# Patient Record
Sex: Female | Born: 1955 | Race: White | Hispanic: No | Marital: Married | Smoking: Never smoker
Health system: Southern US, Community
[De-identification: ages and names within clinical notes are randomized; demographics above are authoritative.]

## PROBLEM LIST (undated history)

## (undated) DIAGNOSIS — Z8349 Family history of other endocrine, nutritional and metabolic diseases: Secondary | ICD-10-CM

## (undated) DIAGNOSIS — D509 Iron deficiency anemia, unspecified: Secondary | ICD-10-CM

## (undated) DIAGNOSIS — M722 Plantar fascial fibromatosis: Secondary | ICD-10-CM

## (undated) DIAGNOSIS — F419 Anxiety disorder, unspecified: Secondary | ICD-10-CM

## (undated) DIAGNOSIS — G4700 Insomnia, unspecified: Secondary | ICD-10-CM

## (undated) DIAGNOSIS — R202 Paresthesia of skin: Secondary | ICD-10-CM

## (undated) HISTORY — PX: SHOULDER SURGERY: SHX246

## (undated) HISTORY — PX: KNEE ARTHROSCOPY: SUR90

## (undated) HISTORY — DX: Family history of other endocrine, nutritional and metabolic diseases: Z83.49

## (undated) HISTORY — DX: Anxiety disorder, unspecified: F41.9

## (undated) HISTORY — DX: Insomnia, unspecified: G47.00

## (undated) HISTORY — DX: Iron deficiency anemia, unspecified: D50.9

## (undated) HISTORY — DX: Paresthesia of skin: R20.2

---

## 1997-09-05 ENCOUNTER — Ambulatory Visit (HOSPITAL_COMMUNITY): Admission: RE | Admit: 1997-09-05 | Discharge: 1997-09-05 | Payer: Self-pay | Admitting: Gastroenterology

## 1997-09-11 ENCOUNTER — Emergency Department (HOSPITAL_COMMUNITY): Admission: EM | Admit: 1997-09-11 | Discharge: 1997-09-11 | Payer: Self-pay | Admitting: Emergency Medicine

## 1997-09-12 ENCOUNTER — Ambulatory Visit (HOSPITAL_COMMUNITY): Admission: RE | Admit: 1997-09-12 | Discharge: 1997-09-12 | Payer: Self-pay

## 1998-06-18 ENCOUNTER — Ambulatory Visit (HOSPITAL_COMMUNITY): Admission: RE | Admit: 1998-06-18 | Discharge: 1998-06-18 | Payer: Self-pay | Admitting: Family Medicine

## 1999-06-05 ENCOUNTER — Encounter: Admission: RE | Admit: 1999-06-05 | Discharge: 1999-06-05 | Payer: Self-pay | Admitting: Obstetrics and Gynecology

## 1999-06-05 ENCOUNTER — Encounter: Payer: Self-pay | Admitting: Obstetrics and Gynecology

## 1999-09-05 ENCOUNTER — Encounter (INDEPENDENT_AMBULATORY_CARE_PROVIDER_SITE_OTHER): Payer: Self-pay | Admitting: Specialist

## 1999-09-05 ENCOUNTER — Ambulatory Visit (HOSPITAL_COMMUNITY): Admission: RE | Admit: 1999-09-05 | Discharge: 1999-09-05 | Payer: Self-pay | Admitting: Gastroenterology

## 1999-09-26 ENCOUNTER — Ambulatory Visit (HOSPITAL_COMMUNITY): Admission: RE | Admit: 1999-09-26 | Discharge: 1999-09-26 | Payer: Self-pay | Admitting: Gastroenterology

## 1999-10-24 ENCOUNTER — Encounter (HOSPITAL_COMMUNITY): Admission: RE | Admit: 1999-10-24 | Discharge: 2000-01-22 | Payer: Self-pay | Admitting: Gastroenterology

## 2003-06-12 ENCOUNTER — Encounter: Admission: RE | Admit: 2003-06-12 | Discharge: 2003-06-26 | Payer: Self-pay | Admitting: Orthopedic Surgery

## 2004-02-14 ENCOUNTER — Ambulatory Visit (HOSPITAL_COMMUNITY): Admission: RE | Admit: 2004-02-14 | Discharge: 2004-02-14 | Payer: Self-pay | Admitting: Orthopedic Surgery

## 2004-02-14 ENCOUNTER — Ambulatory Visit (HOSPITAL_BASED_OUTPATIENT_CLINIC_OR_DEPARTMENT_OTHER): Admission: RE | Admit: 2004-02-14 | Discharge: 2004-02-14 | Payer: Self-pay | Admitting: Orthopedic Surgery

## 2004-03-06 ENCOUNTER — Ambulatory Visit (HOSPITAL_BASED_OUTPATIENT_CLINIC_OR_DEPARTMENT_OTHER): Admission: RE | Admit: 2004-03-06 | Discharge: 2004-03-06 | Payer: Self-pay | Admitting: Orthopedic Surgery

## 2007-07-21 ENCOUNTER — Ambulatory Visit (HOSPITAL_BASED_OUTPATIENT_CLINIC_OR_DEPARTMENT_OTHER): Admission: RE | Admit: 2007-07-21 | Discharge: 2007-07-21 | Payer: Self-pay | Admitting: Orthopedic Surgery

## 2008-04-20 ENCOUNTER — Encounter: Admission: RE | Admit: 2008-04-20 | Discharge: 2008-04-20 | Payer: Self-pay | Admitting: Family Medicine

## 2008-04-20 IMAGING — MG MM SCREEN MAMMOGRAM BILATERAL
4 series · 4 of 4 positions shown · non-contrast
Comparison: Prior studies.

DG SCREEN MAMMOGRAM BILATERAL
Bilateral CC and MLO view(s) were taken.
Technologist: K-H

DIGITAL SCREENING MAMMOGRAM WITH CAD:

[R CC]
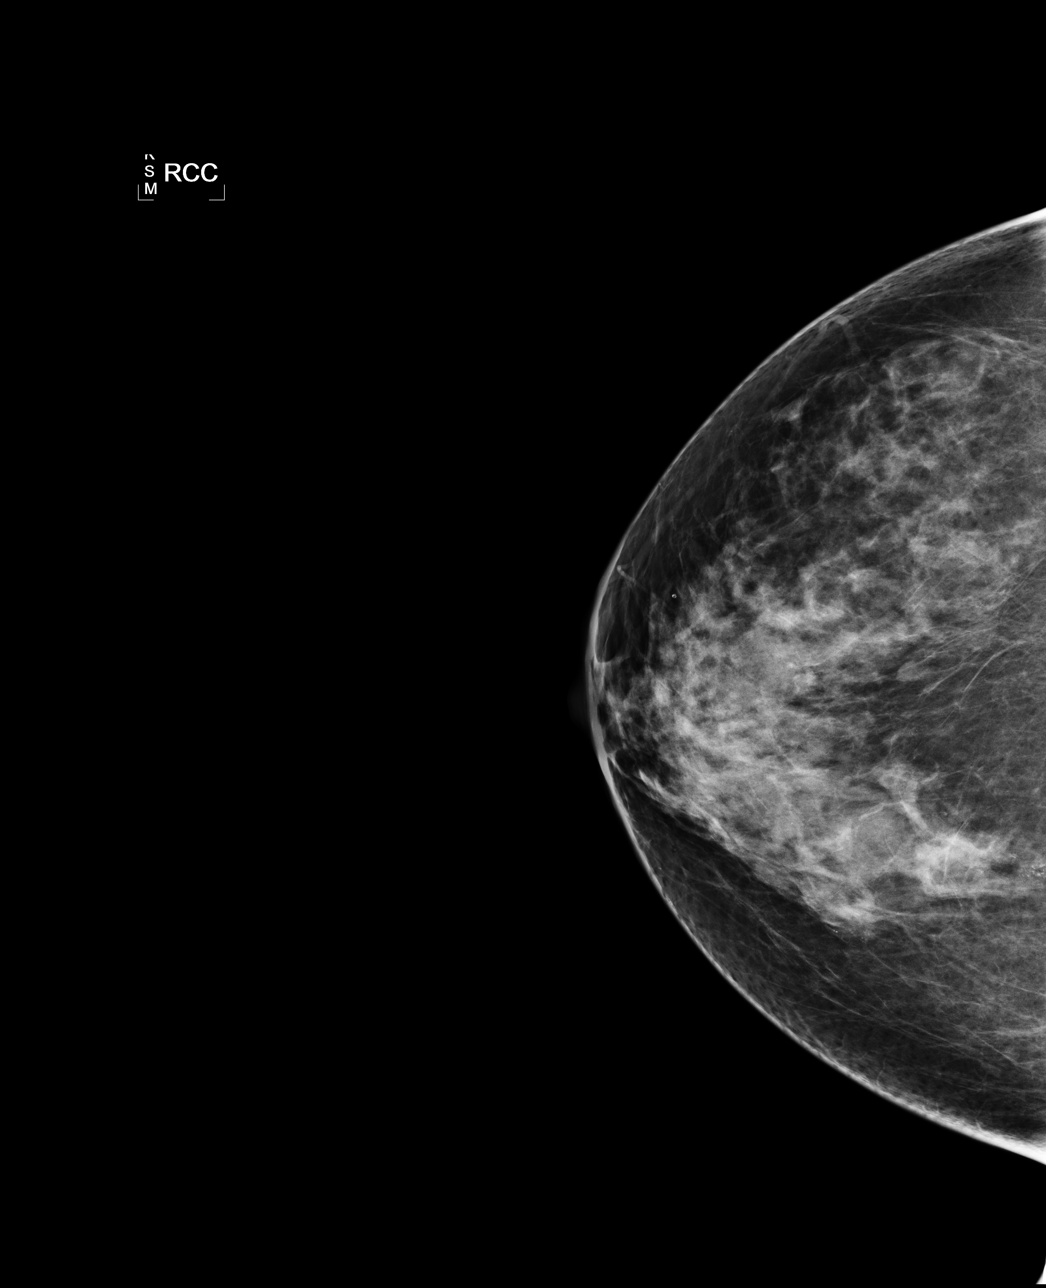

[L CC]
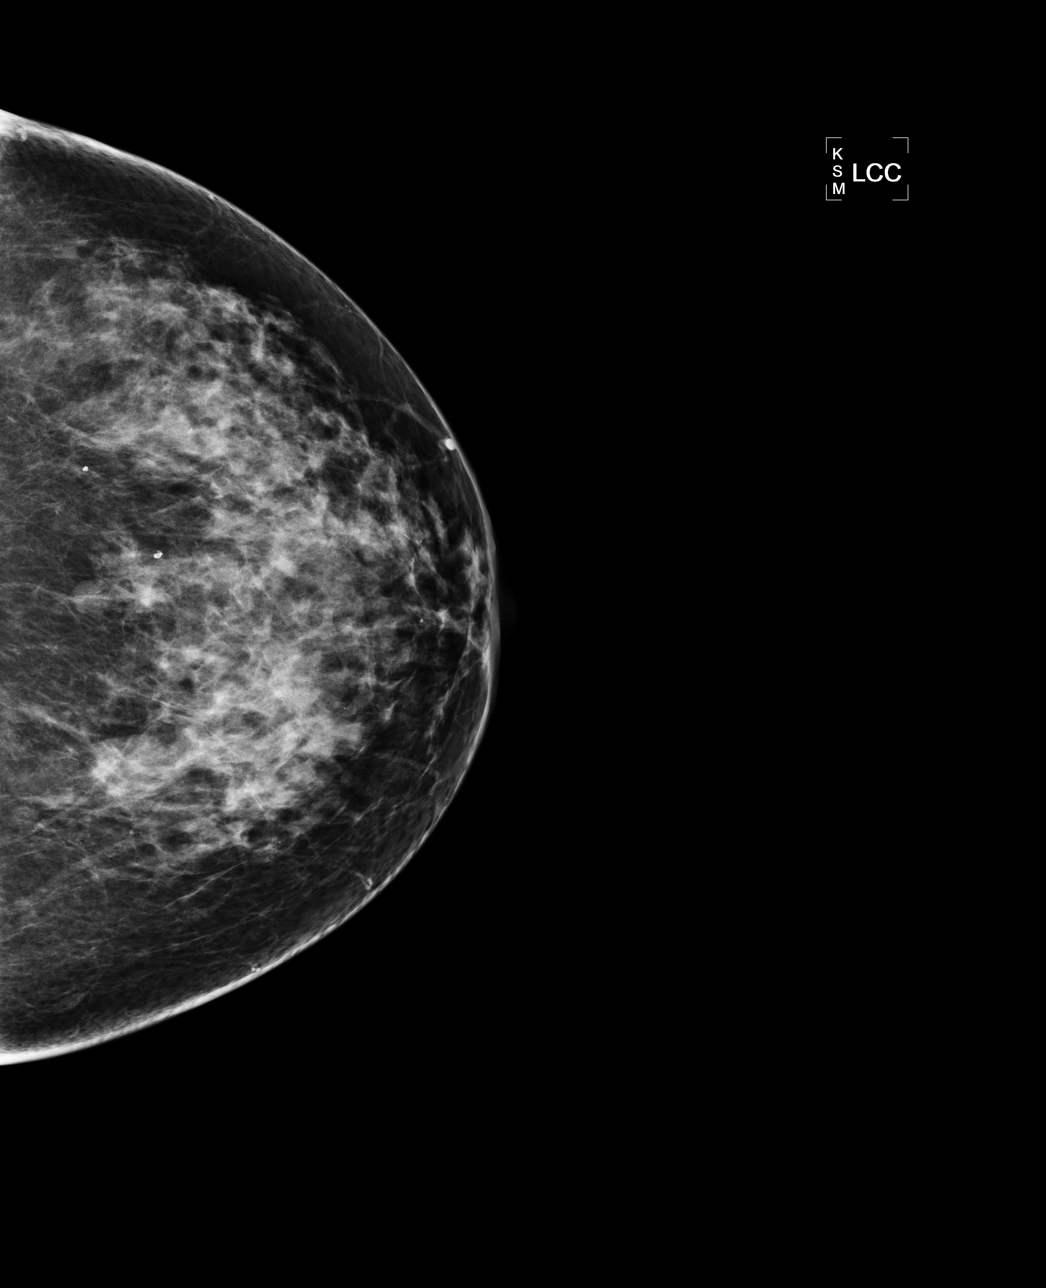

[L MLO]
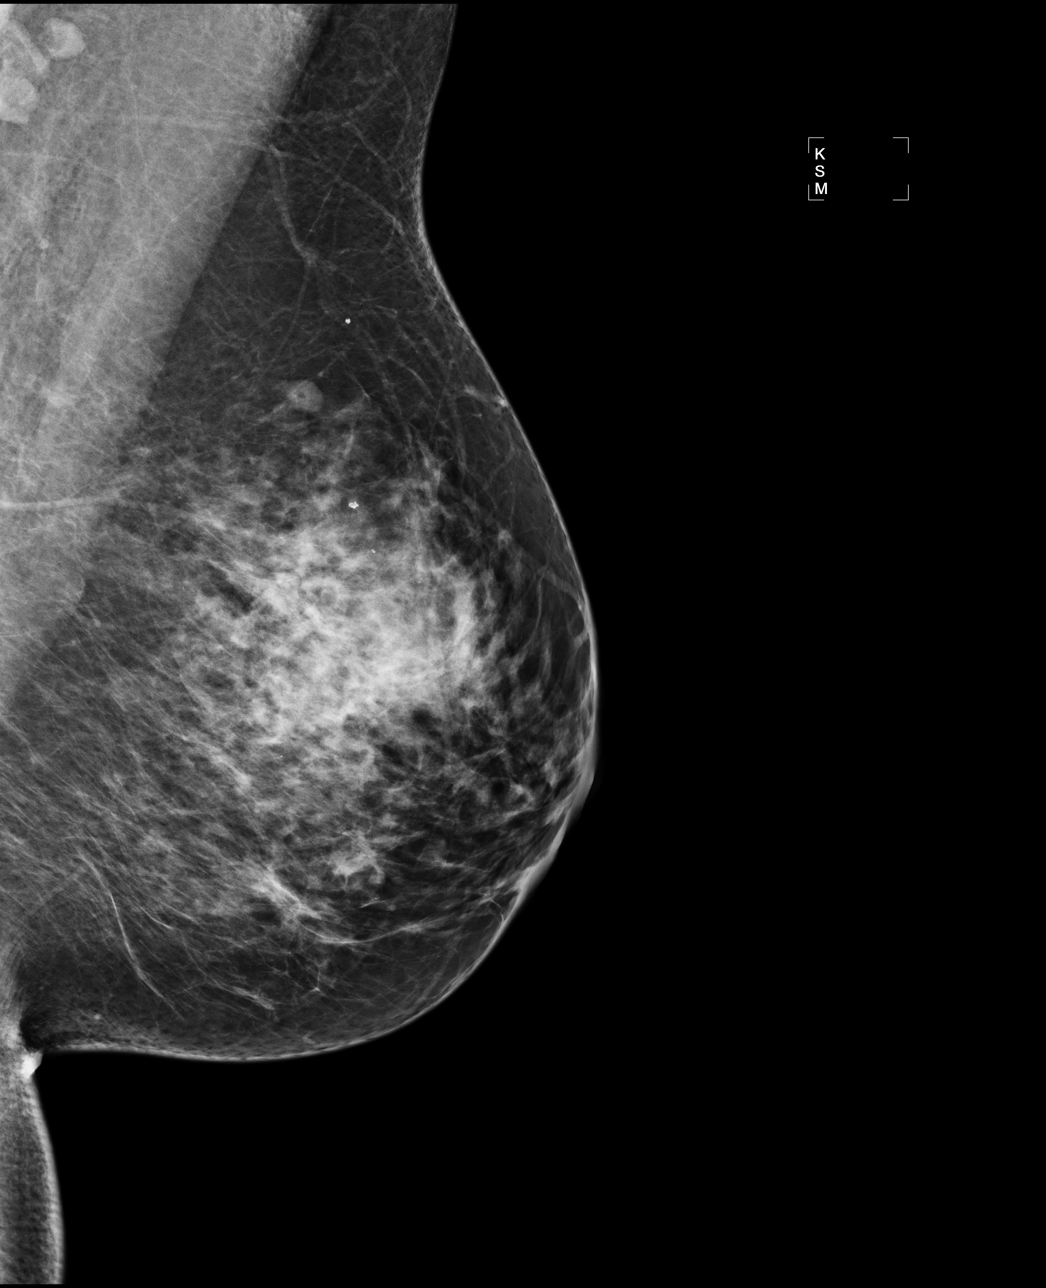

[R MLO]
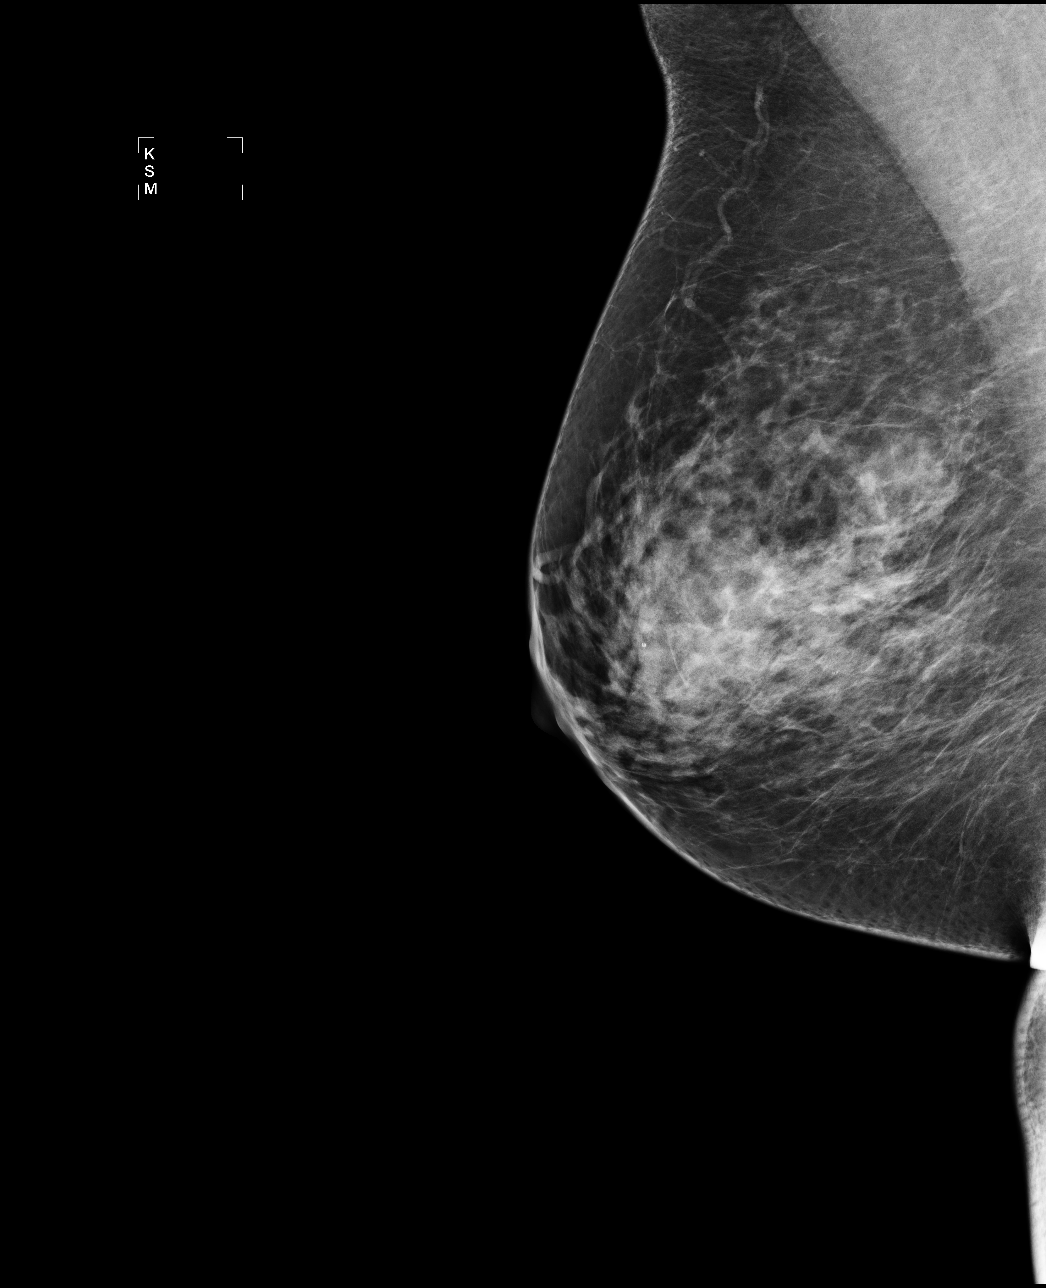

[4 of 4 positions shown; findings below may reference images not displayed]

The breast tissue is heterogeneously dense.  A possible mass is noted in the left breast.  Spot 
compression views and possibly sonography are recommended for further evaluation.  There are 
calcifications in the right breast.  Characterization with magnification views is recommended.
IMPRESSION: Possible mass, left breast and calcifications right breast.   Additional evaluation is indicated.  
The patient will be contacted for additional studies and a supplemental report will follow.

ASSESSMENT: Need additional imaging evaluation and/or prior mammograms for comparison - BI-RADS 0

Further imaging of both breasts.
ANALYZED BY COMPUTER AIDED DETECTION. , THIS PROCEDURE WAS A DIGITAL MAMMOGRAM.

## 2008-05-04 ENCOUNTER — Encounter: Admission: RE | Admit: 2008-05-04 | Discharge: 2008-05-04 | Payer: Self-pay | Admitting: Family Medicine

## 2008-05-04 ENCOUNTER — Encounter (INDEPENDENT_AMBULATORY_CARE_PROVIDER_SITE_OTHER): Payer: Self-pay | Admitting: Family Medicine

## 2008-05-04 IMAGING — MG MM BREAST STEREO BIOPSY*R*
3 series · 3 of 3 positions shown · non-contrast
Comparison: none

Addendum Begins

The final pathological diagnosis is benign breast parenchyma with
fibrocystic changes and numerous microcalcifications.  This is
concordant with the imaging findings.  The results were discussed
with the patient by telephone on [DATE].  Her questions were
answered.  She reported some residual tenderness at the biopsy site
with no bruising or palpable hematoma.  A six month followup right
diagnostic mammogram, with magnification views, is recommended in 6
months.  This was discussed with the patient.
Addendum Ends
CLINICAL DATA: Suspicious clusters of calcifications within the
inner upper right breast.

[R CC (1 of 2)]
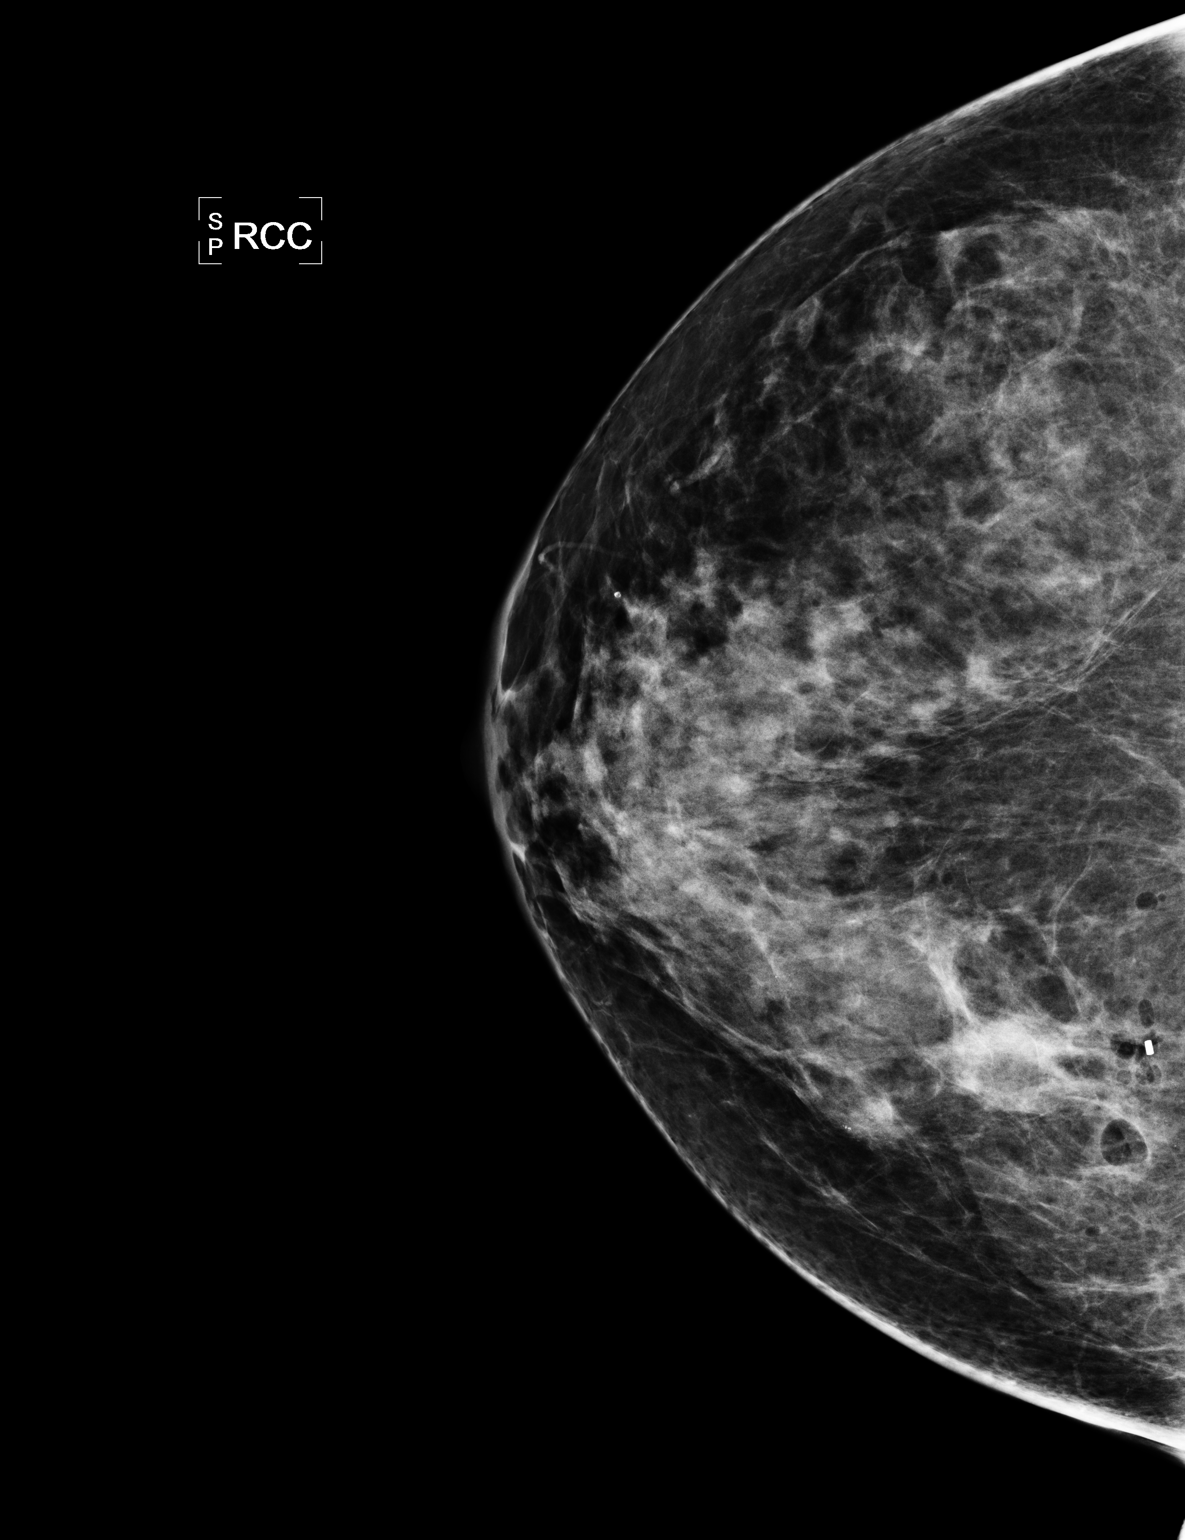

[R CC (2 of 2)]
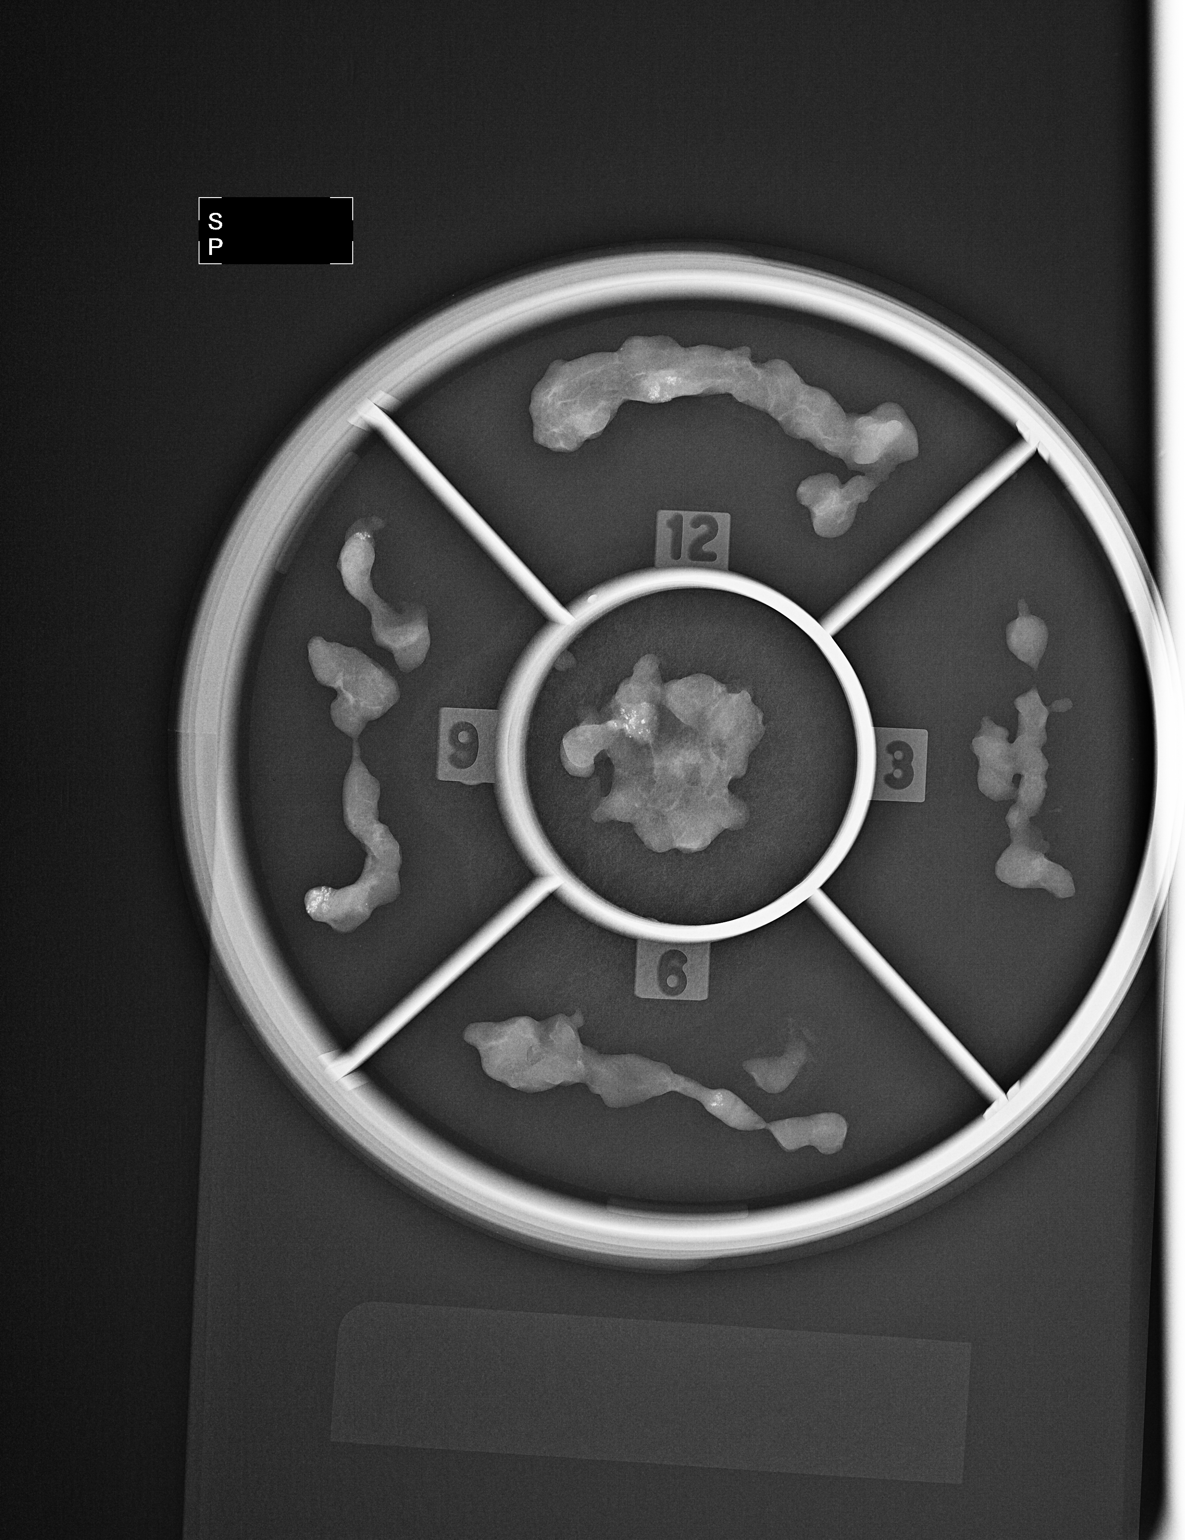

[R ML]
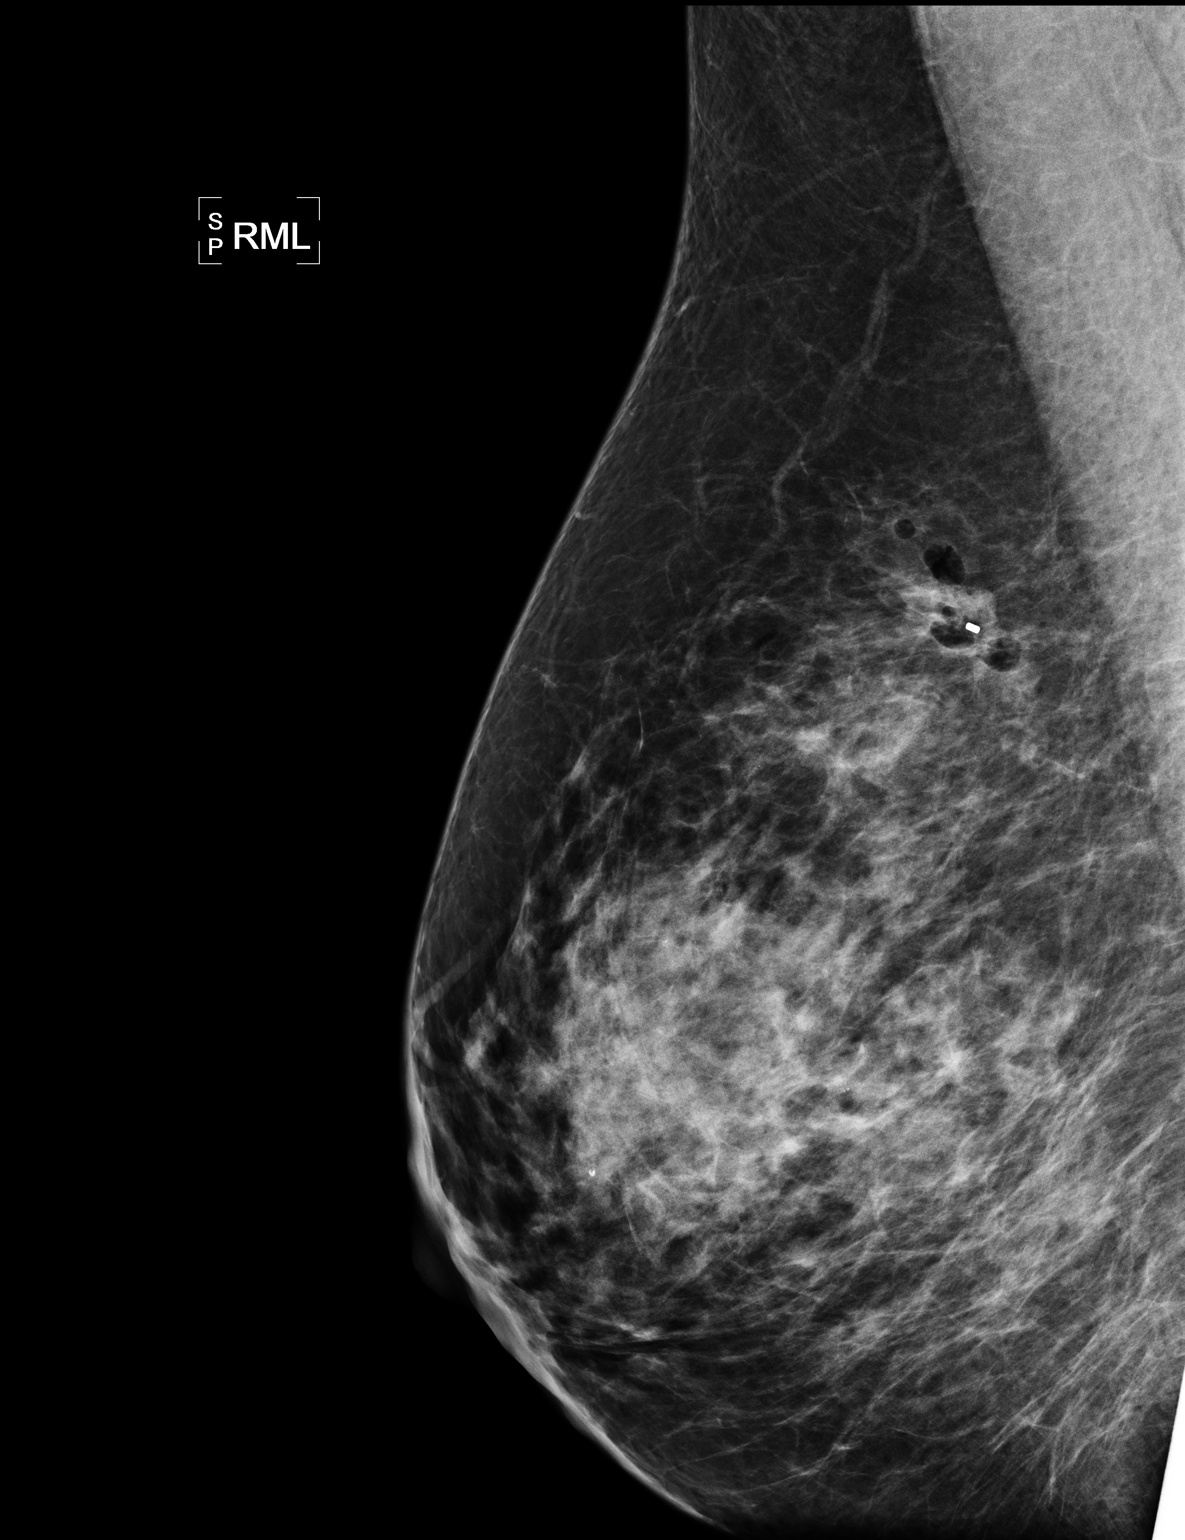

[3 of 3 positions shown; findings below may reference images not displayed]

STEREOTACTIC-GUIDED VACUUM ASSISTED BIOPSY OF THE RIGHT BREAST AND
SPECIMEN RADIOGRAPH

I met with the patient, and we discussed the procedure of
stereotactic-guided biopsy, including risks, benefits, and
alternatives.  Specifically, we discussed the risks of infection,
bleeding, tissue injury, clip migration, and inadequate sampling.
Informed, written consent was given.

Using sterile technique, 2% lidocaine, stereotactic guidance, and a
9 gauge vacuum assisted device, biopsy was performed of the larger
cluster of heterogeneous calcifications within the inner upper
right breast.  Specimen radiograph was performed, showing
calcifications within the specimen.  Specimens with calcifications
are identified for pathology.

At the conclusion of the procedure, a tissue marker clip was
deployed into the biopsy cavity.  Follow-up 2-view mammogram
confirmed clip placement to be 1 cm lateral to the biopsied
calcifications.
IMPRESSION: Stereotactic-guided biopsy of right breast calcifications.  Please
note that the clip lies 1 cm lateral to the biopsy site.

If the biopsy is negative, then 6 months follow-up is recommended
of both of these clusters of calcifications.  The biopsy is
positive for carcinoma, then biopsy of the adjacent smaller second
area of heterogeneous calcifications and / or MRI may be warranted.

## 2008-05-04 IMAGING — MG MM DIAGNOSTIC LTD BILATERAL
6 series · 6 of 6 positions shown · non-contrast
Comparison: [DATE] and [DATE]

CLINICAL DATA: Abnormal screening mammogram with right breast
calcifications and possible left breast mass.

[REDACTED] BILATERAL  MAMMOGRAM   AND BILATERAL
BREAST ULTRASOUND:

[L CC]
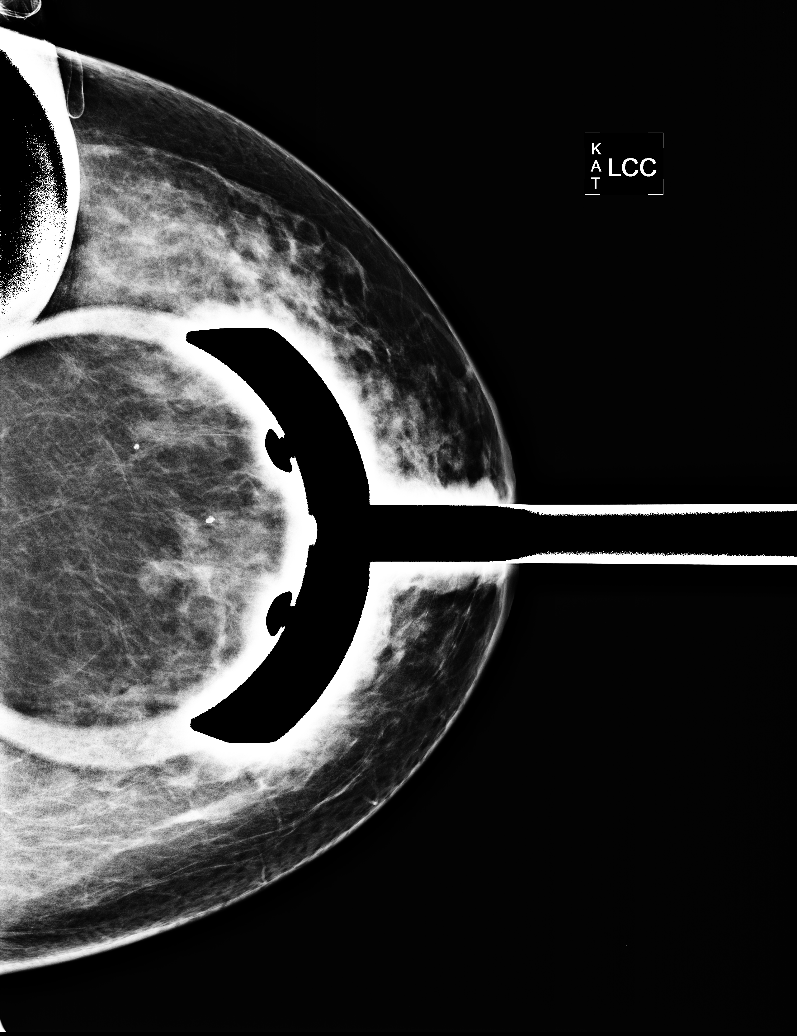

[R ML (1 of 2)]
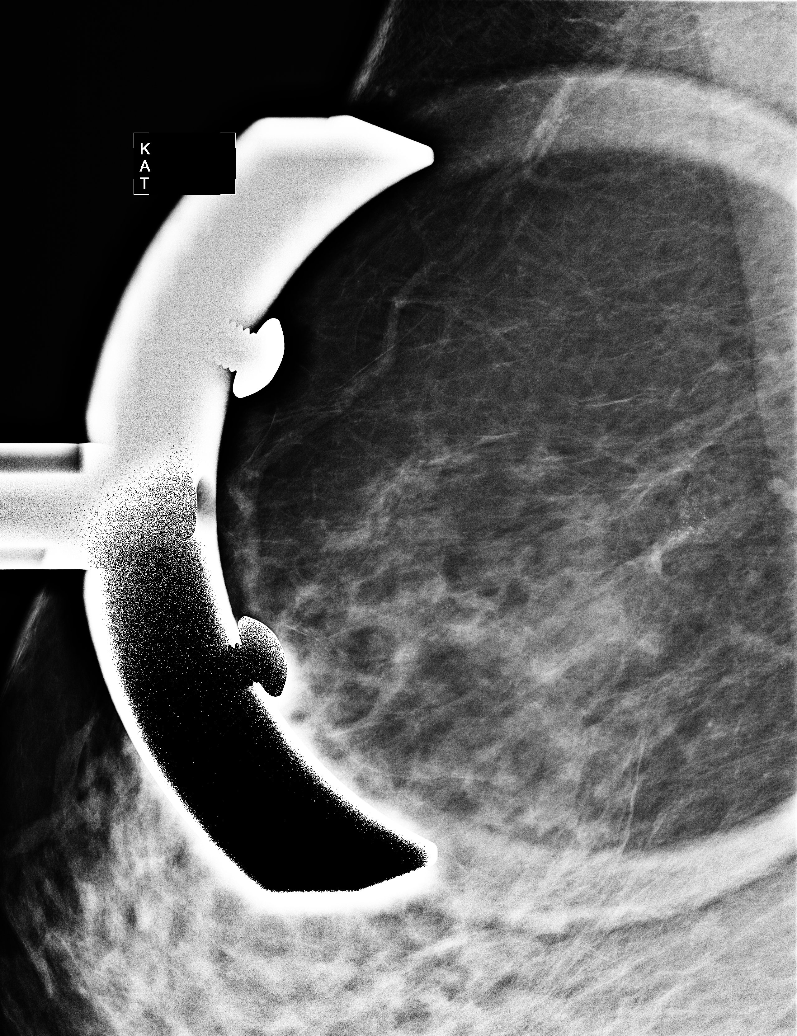

[L MLO]
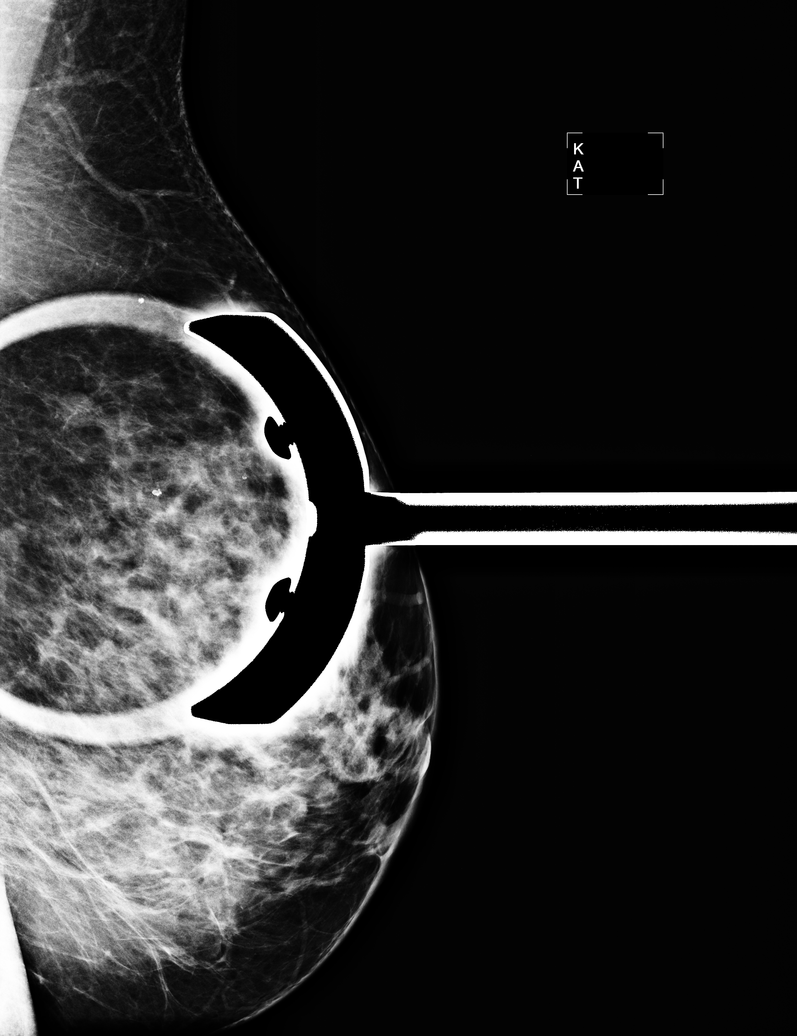

[R CC (1 of 2)]
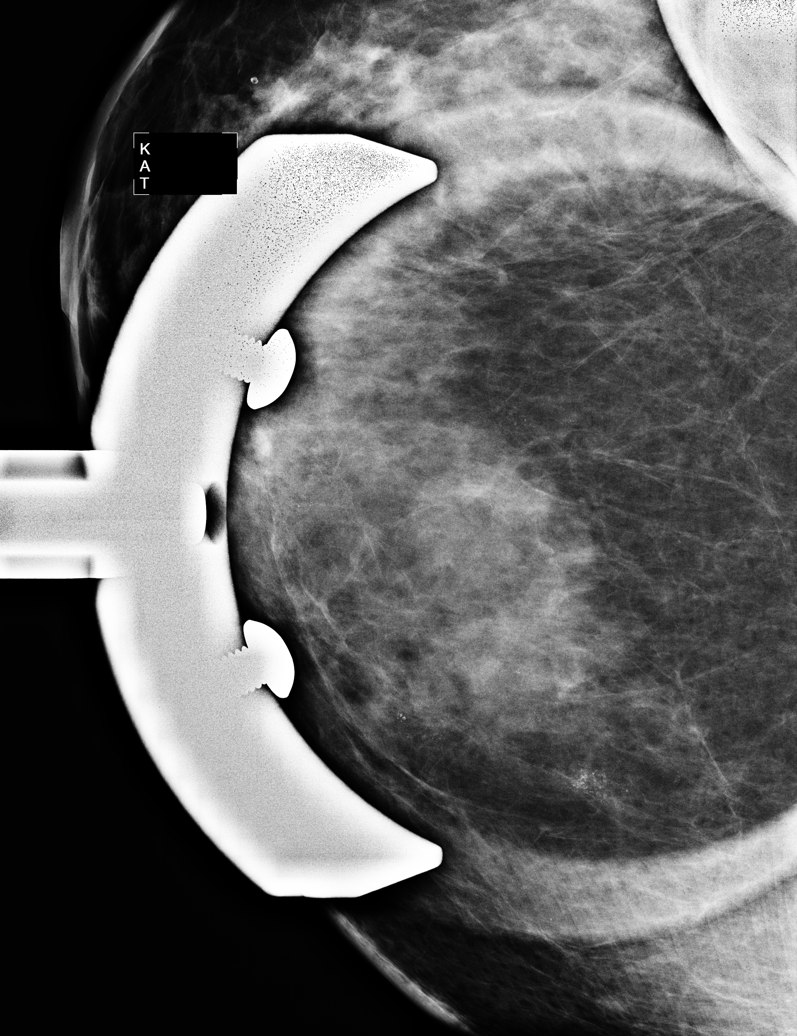

[R ML (2 of 2)]
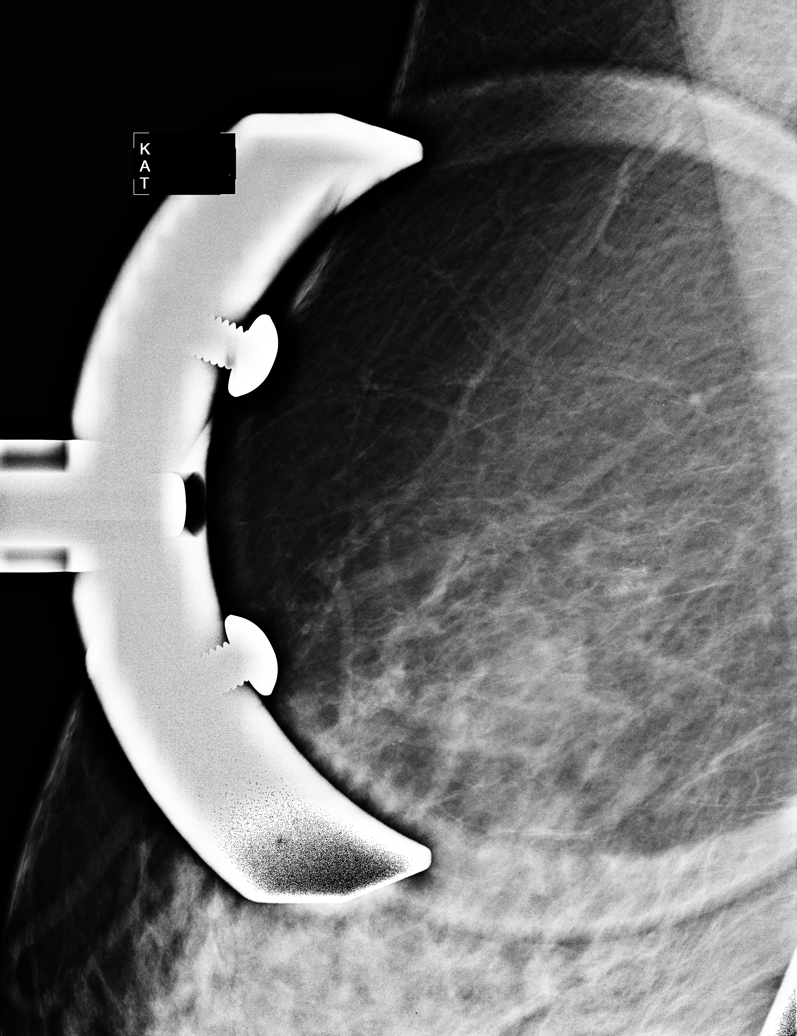

[R CC (2 of 2)]
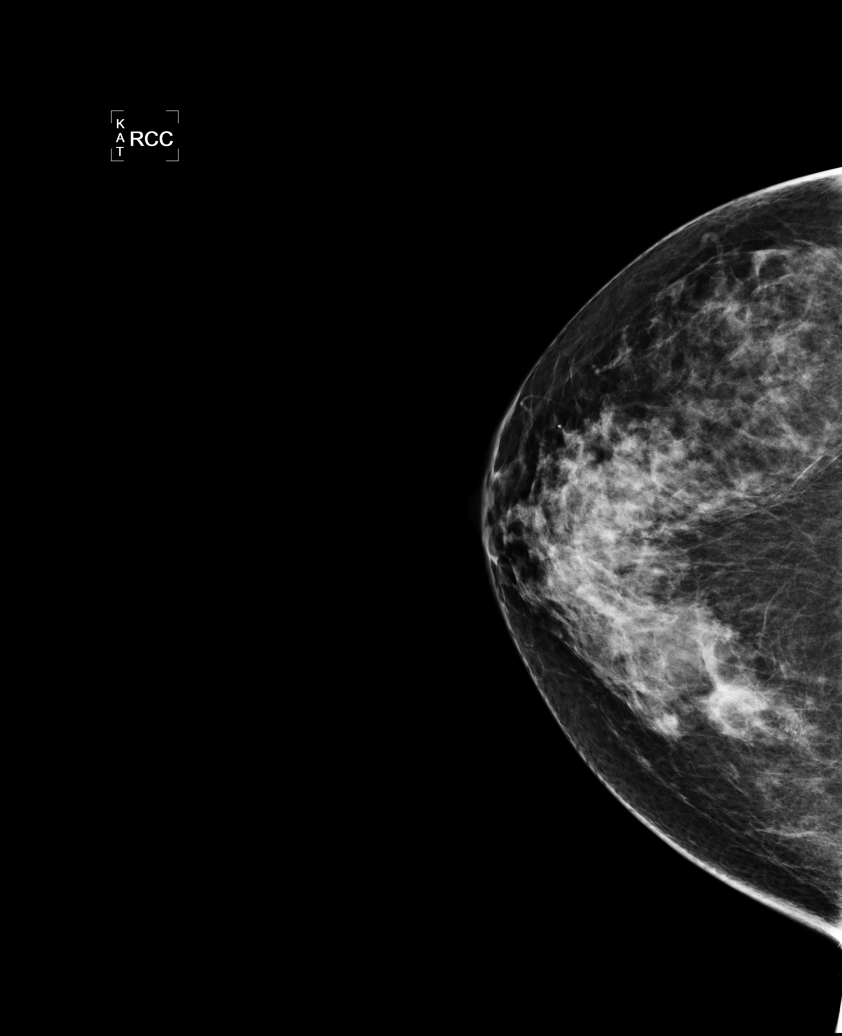

[6 of 6 positions shown; findings below may reference images not displayed]

FINDINGS: CC and MLO magnification views of the inner upper right
breast and CC and MLO spot compression views of the posterior upper
left breast are performed.
In the right breast, there is a cluster of heterogeneous
calcifications within the inner upper right breast, approximately 1
cm in length.  No evidence of associated mass is identified.
There is a second smaller cluster of heterogeneous calcifications
approximately 2 cm anterior lateral to this larger cluster on the
CC magnification views.
In the left breast, there is a 6 mm circumscribed rounded nodule in
the upper left breast.

On physical exam, no palpable abnormalities are identified within
the inner upper right breast or upper left breast.

Ultrasound is performed, showing no evidence of solid or cystic
mass, distortion, or abnormal areas of shadowing within the inner
upper right breast.
Ultrasound demonstrates a 6 mm simple cyst in the 12 o'clock
position of the left breast 4 cm from the nipple, corresponding to
the mammogram abnormality.
IMPRESSION: Suspicious clusters of calcifications within the inner upper right
breast - tissue sampling is recommended.  Options were discussed
with the patient and we will proceed with the stereotactic guided
biopsy of the right breast, which will be performed today but
dictated in a separate report.

Simple cyst in the upper left breast corresponding to the screening
mammogram abnormality.

These findings were discussed with the patient.

BI-RADS CATEGORY 4:  Suspicious abnormality - biopsy should be
considered.

Recommend stereotactic guided biopsy of the right breast, which
will be performed today but dictated in a separate report.

## 2009-01-29 ENCOUNTER — Encounter: Admission: RE | Admit: 2009-01-29 | Discharge: 2009-01-29 | Payer: Self-pay | Admitting: Family Medicine

## 2009-01-29 IMAGING — MG MM DIGITAL DIAGNOSTIC UNILAT R
5 series · 5 of 5 positions shown · non-contrast
Comparison: [DATE]

CLINICAL DATA: History of benign right breast biopsy for
calcifications demonstrating fibrocystic change in [DATE].
Short-term follow-up.

DIGITAL DIAGNOSTIC UNILATERAL RIGHT MAMMOGRAM WITH CAD

[R CC (1 of 2)]
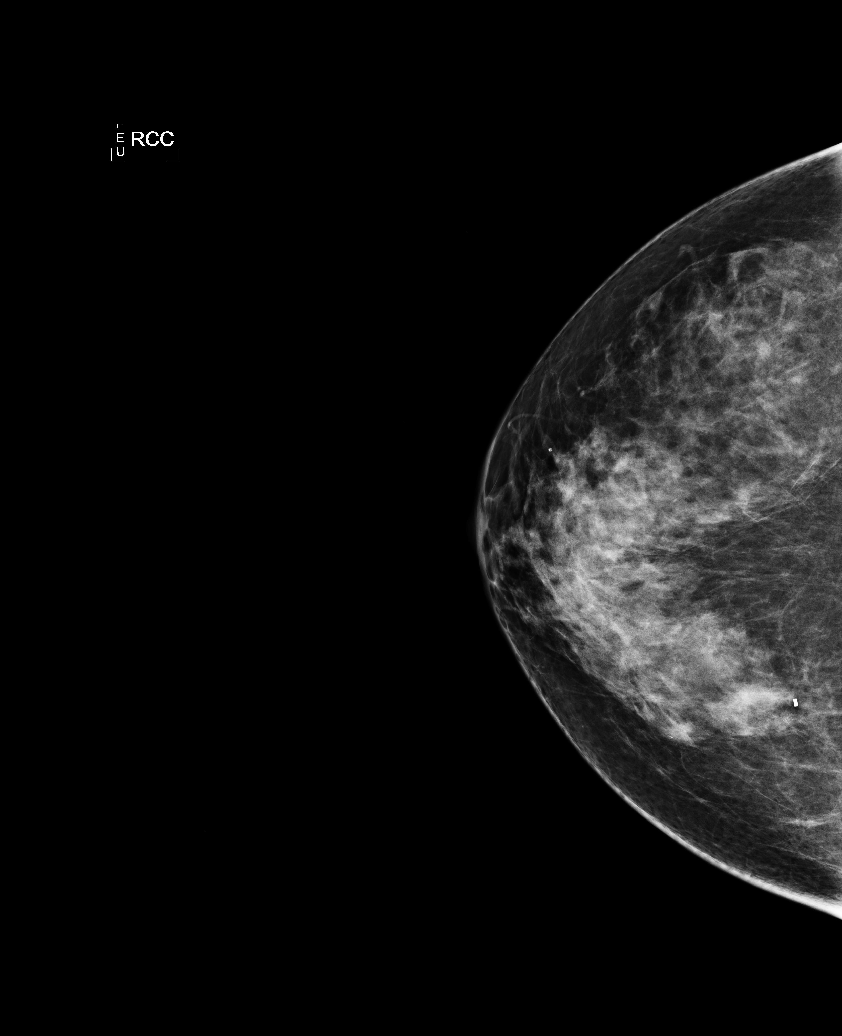

[R MLO]
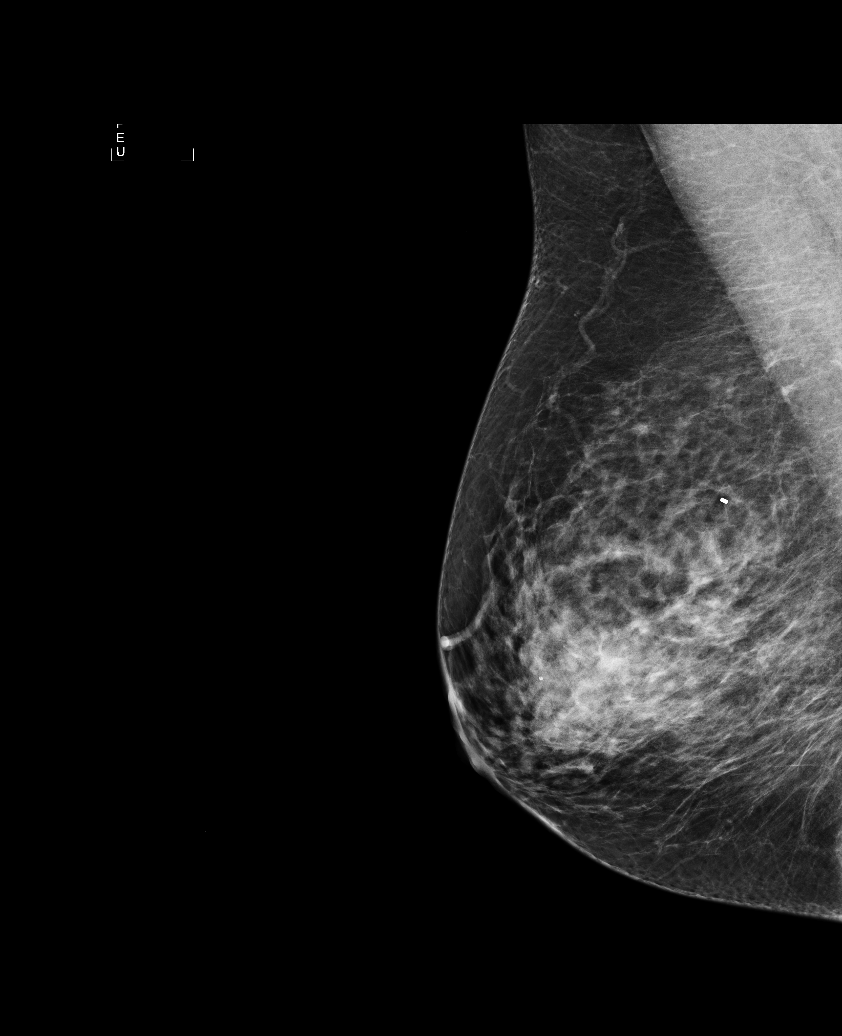

[R CC (2 of 2)]
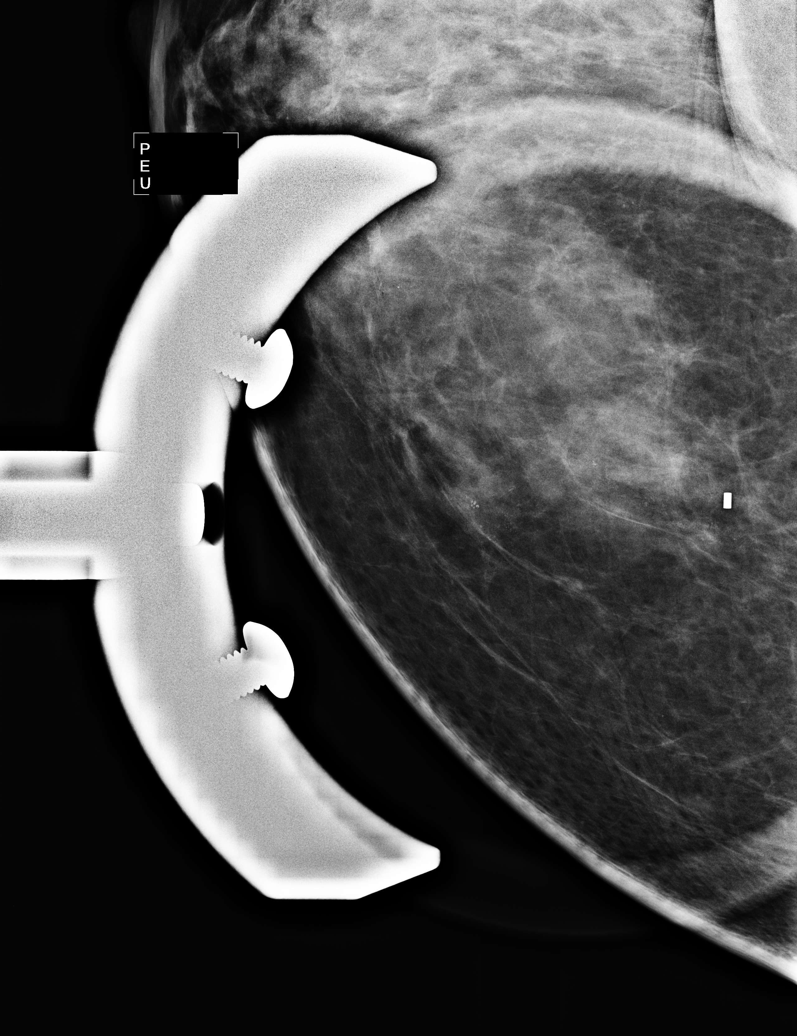

[R ML (1 of 2)]
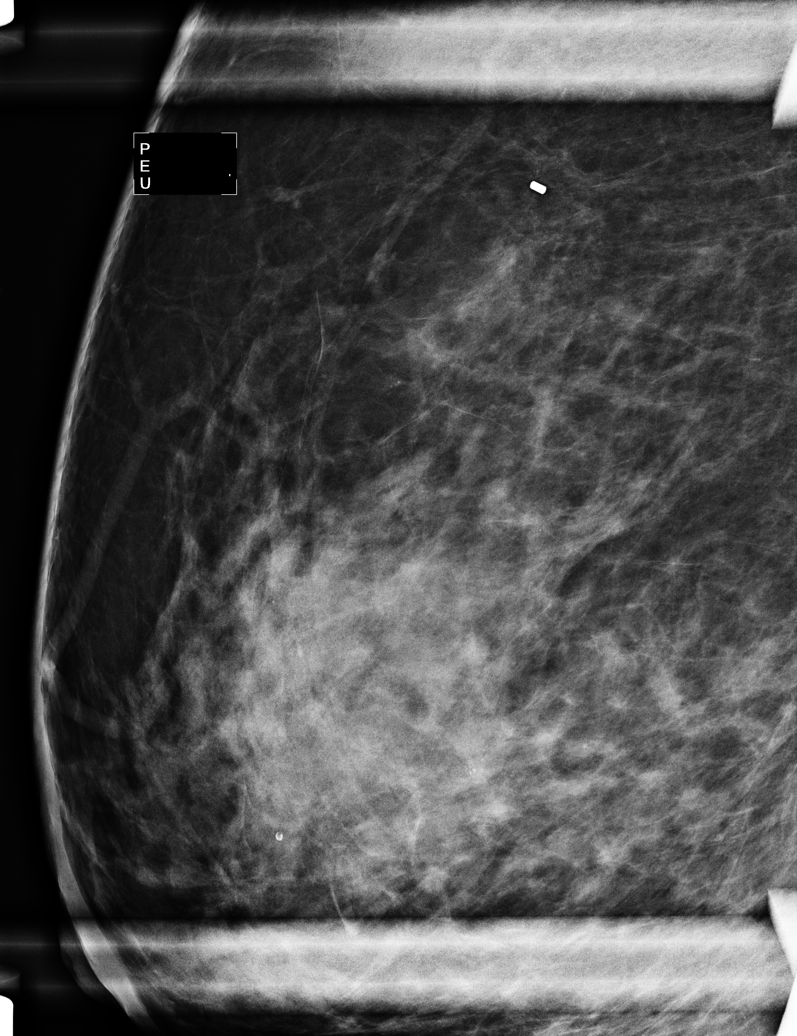

[R ML (2 of 2)]
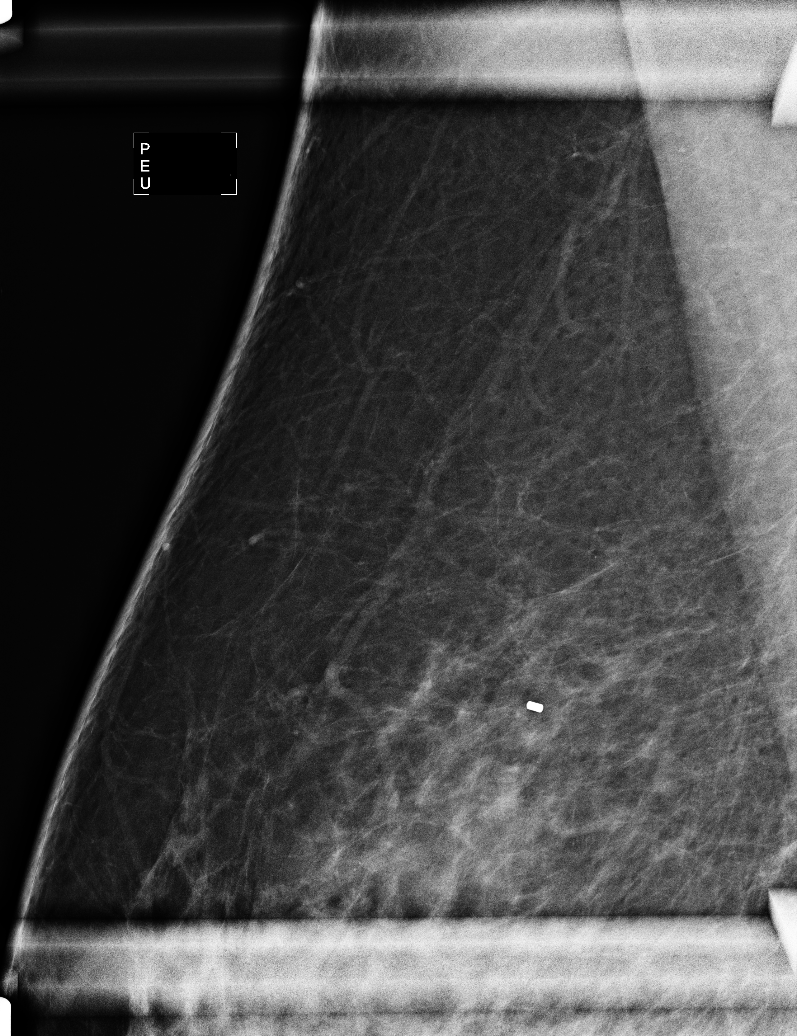

[5 of 5 positions shown; findings below may reference images not displayed]

FINDINGS: The right breast parenchyma is heterogeneously dense,
which may limit the sensitivity of mammography.  Stable benign
appearing scattered and grouped punctate calcifications are noted
in the right breast.  No new worrisome finding is seen.  There is
no significant interval change.
Mammographic images were processed with CAD.
IMPRESSION: No evidence for malignancy in the right breast.  No interval
change.  Screening mammography is recommended [DATE] to
maintain the patient's yearly screening schedule. Findings and
recommendations discussed with the patient and provided in written
form at the time of the exam.

BI-RADS CATEGORY 2:  Benign finding(s).

## 2009-12-18 ENCOUNTER — Emergency Department (HOSPITAL_COMMUNITY): Admission: EM | Admit: 2009-12-18 | Discharge: 2009-12-18 | Payer: Self-pay | Admitting: Family Medicine

## 2010-07-22 NOTE — Op Note (Signed)
NAME:  Rebecca Strong, Rebecca Strong            ACCOUNT NO.:  [id number]   MEDICAL RECORD NO.:  [id number]          PATIENT TYPE:  AMB   LOCATION:  [place]                          FACILITY:  [facility]   PHYSICIAN:  Rodney A. 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  She has had a total of 3 injections and each became less  effective.  She is now awakened at night and cannot straight her arm out  and she requested a surgical release of lateral condyle extensor wad.  Complications discussed preoperatively.  Questions were answered and  encouraged.   JUSTIFICATION FOR OUTPATIENT SURGERY:  Minimal morbidity.   PREOPERATIVE DIAGNOSIS:  Chronic lateral epicondylitis.   POSTOPERATIVE DIAGNOSIS:  Chronic lateral epicondylitis.   OPERATION:  Debridement and excision of mucinous degenerative tissue in  the lateral epicondylar wad and osteotomy, lateral condyle.   SURGEON:  Geroge Baseman. Alphonzo Cruise, MD   ANESTHESIA:  General.   PROCEDURE IN DETAIL:  The patient was placed on the table in supine  position.  Pneumatic tourniquet applied to the left upper extremity.  After satisfactory anesthesia, left upper extremity was prepped with  DuraPrep and draped out in the usual manner.  The arm was wrapped out  with Esmarch and tourniquet was elevated.  Loupe magnification was used  throughout.  Incision made over the lateral epicondyle.  Skin edges were  retracted.  Bleeders were coagulated.  Self-retaining retractors were  put in place.  The fascia was incised longitudinally and a small gap was  inserted which exposed the lateral epicondyle.  There was definite  mucinous degenerative tissue in this area.  This was excised sharply  with a knife under loupe magnification.  The area was also debrided with  a small rongeur.  All  degenerative tissue was excised.  Great care was  taken to avoid any entry into the capsule laterally.  A small osteotome  was then used to remove the flake of bone off the lateral epicondyle and  this was also debrided with a rongeur.  All degenerative tissue was  completely excised.  Bleeders were coagulated.  The fascia was closed  with a running 2-0 Vicryl suture.  4-0 Vicryl was used to close the  subcutaneous tissue and a running subcuticular closure of the skin and  Steri-Strips was used.  Marcaine replaced about the elbow.  A bulky  pressure dressing was applied and the patient returned to recovery room  in excellent condition.  Technically, procedure went extremely well.   DRAINS:  None.   COMPLICATIONS:  None.   DISPOSITION:  1. Percocet for pain.  2. To my office on Wednesday.  3. Usual postop instructions were given.      Rodney A. Alphonzo Cruise, M.D.  Electronically Signed     RAM/MEDQ  D:  07/21/2007  T:  07/21/2007  Job:  [id number]

## 2010-07-25 NOTE — Op Note (Signed)
NAME:  Rebecca Strong, STROM            ACCOUNT NO.:  [id number]   MEDICAL RECORD NO.:  [id number]          PATIENT TYPE:  AMB   LOCATION:  [place]                          FACILITY:  [facility]   PHYSICIAN:  Rodney A. Mortenson, M.D.DATE OF BIRTH:  1955/10/20   DATE OF PROCEDURE:  02/14/2004  DATE OF DISCHARGE:                                 OPERATIVE REPORT   PREOPERATIVE DIAGNOSIS:  Chronic medial epicondylitis left elbow.   POSTOPERATIVE DIAGNOSIS:  Chronic medial epicondylitis left elbow.   OPERATION:  Release flexor origin and small osteotomy medial epicondyle left  elbow.   SURGEON:  Geroge Baseman. Alphonzo Cruise, M.D.   ANESTHESIA:  General anesthesia.   DESCRIPTION OF PROCEDURE:  The patient is placed on the operating table in  the supine position.  Pneumatic tourniquet applied to the left upper arm.  The left upper extremity is prepped with DuraPrep and draped out in the  usual manner.  Arm was wrapped out with Esmarch and tourniquet was elevated.  Loupe magnification was used throughout.  Incision made over the medial  epicondyle.  Self-retaining retractor was put in position.  Dissection  carried down bluntly to the flexor origin. There was a great deal of a  dimness looking material.  Evidence of previous local injections with  cortisone injections.  This tissue was mucinous and certainly was not normal  tendinous structure.  The flexor origin was totally excised and all this  degenerative tissue was removed.  A small osteotome was then used to do an  osteotomy at the medial epicondyle.  All degenerative tissue was completely  excised.  Excellent decompression was achieved.  All excess bone was  removed.  Bone wax was then used to cover the exposed surface of the bone  and excess bone wax was removed.  Fascia was closed with 2-0 Vicryl and skin  closed with stainless steel staples.  Marcaine was placed about the wound.  Sterile dressings were applied and the patient returned to the  recovery room  in excellent condition.  Technically this procedure went extremely well.   DRAINS:  None.   COMPLICATIONS:  None.   I was very pleased with surgical outcome.       RAM/MEDQ  D:  02/14/2004  T:  02/14/2004  Job:  [id number]

## 2010-07-25 NOTE — Op Note (Signed)
NAME:  Rebecca Strong, Rebecca Strong            ACCOUNT NO.:  [id number]   MEDICAL RECORD NO.:  [id number]          PATIENT TYPE:  AMB   LOCATION:  [place]                          FACILITY:  [facility]   PHYSICIAN:  Rodney A. Mortenson, M.D.DATE OF BIRTH:  May 18, 1955   DATE OF PROCEDURE:  03/06/2004  DATE OF DISCHARGE:                                 OPERATIVE REPORT   PREOPERATIVE DIAGNOSIS:  Chronic lateral epicondylitis, right elbow.   POSTOPERATIVE DIAGNOSIS:  Chronic lateral epicondylitis, right elbow.   OPERATION:  Release extensor origin right elbow, excision of mucinous  degenerative tissue, osteotomy of lateral epicondyle, right elbow.   SURGEON:  Geroge Baseman. Alphonzo Cruise, M.D.   ANESTHESIA:  General.   PROCEDURE:  The patient placed on the operating table in supine position  with pneumatic tourniquet about the right upper arm. The right upper  extremity was prepped and draped in the usual manner. The arm was then  wrapped with an Esmarch, and tourniquet was elevated. An incision was made  centered above the lateral epicondyle and carried distally to the level of  radial head. Bleeders were coagulated. Self-retracting retractors were put  in the wound. All surgery was done under loop magnification. The fascia was  split longitudinally, and retractor was used to hold the fascial part  exposed and extensor origin. There was a great deal of mucinous degenerative  tissue about the extensor origin. Cortisone injection could be seen with  deposits noted. Using a sharp knife, the extensor origin was excised, and  mucinous degenerated tissue was excised. Complete release of the extensor  origin was achieved very nicely. The elbow joint itself and the synovium was  not violated. An osteotome was used to take off a small portion of the  lateral epicondyle. Excellent decompression of the area of inflammation and  degenerative tissue was accomplished. Bone wax was placed over the raw bone,  and excess bone  wax removed. The subcutaneous tissue was closed with 2-0  Vicryl, and staples were used to the close the skin. Marcaine was placed  about the incision and the wound. Sterile dressings were applied, and the  patient returned to recovery room in excellent condition. The procedure went  extremely well, is very pleased with the surgical outcome.       RAM/MEDQ  D:  03/06/2004  T:  03/06/2004  Job:  [id number]

## 2010-07-25 NOTE — Op Note (Signed)
[facility]  Patient:    Rebecca Strong, Rebecca Strong                   MRN: [id number] Adm. Date:  [id number] Attending:  Rafael Bihari CC:         Varney Baas, M.D.                           Operative Report  PROCEDURE:  Colonoscopy with biopsy.  INDICATIONS FOR PROCEDURE:  Ulcerative colitis with increasing refractory disease, poorly responsive to Rowasa enemas as well as Asacol and with sluggish response to prednisone.  The procedure is to reassess anatomic distribution and extent of her disease prior to considering second-line agents such as Remicade and 6-MP.  PROCEDURE:  The patient was placed in the left lateral decubitus position and place don the pulse monitor with continuous low-flow oxygen delivered by nasal cannula.  She was sedated with 80 mg IV Demerol and 9 mg IV Versed.  The Olympus video colonoscope was inserted into the rectum and advanced to the cecum confirmed by transillumination of McBurneys point and visualization of the ileocecal valve and appendiceal orifice.  The prep was excellent.  The cecum appeared normal, as did the majority of the ascending colon. Unfortunately I was unable to intubate the terminal ileum.  Within the transverse colon and descending colon was a segment of fibrotic, scarred colon with multiple pseudopolyps consistent with chronic or "burned out" colitis. No biopsies were taken from this area.  The mucosa, other than the fibrosis in the pseudopolyps did not appear visibly inflamed.  There was a transition to normal mucosa extending through the majority of the sigmoid colon and the proximal rectus.  In the mid rectum and distal rectum there was an intense confluent colitis with erythema, edema, friability and thick exudate consistent with severe colitis.  This extended all the way down to the anus. Biopsies were taken from this area and sent in a separate specimen container. The colonoscope was then withdrawn  and the patient returned to the recovery room in stable condition.  She tolerated the procedure well and there were no immediate complications.  IMPRESSION:  Severe acute proctitis with evidence of chronic ulcerative colitis involving the transverse and descending colon with some skip areas possibly indicative of Crohns disease.  PLAN: Await biopsy results and we will probably proceed with Remicade and 6-MP We will consider Cortifoam given the rather limited distribution of the severe proctitis. DD:  09/05/99 TD:  09/06/99 Job: [id number]

## 2013-04-19 ENCOUNTER — Ambulatory Visit (INDEPENDENT_AMBULATORY_CARE_PROVIDER_SITE_OTHER): Payer: Self-pay | Admitting: Podiatry

## 2013-04-19 ENCOUNTER — Encounter: Payer: Self-pay | Admitting: Podiatry

## 2013-04-19 VITALS — BP 149/88 | HR 80 | Resp 16

## 2013-04-19 DIAGNOSIS — M722 Plantar fascial fibromatosis: Secondary | ICD-10-CM

## 2013-04-19 MED ORDER — TRIAMCINOLONE ACETONIDE 10 MG/ML IJ SUSP
10.0000 mg | Freq: Once | INTRAMUSCULAR | Status: AC
  Administered 2013-04-19 (×2): 10 mg

## 2013-04-19 MED ORDER — TRIAMCINOLONE ACETONIDE 10 MG/ML IJ SUSP: 10.0000 mg | Freq: Once | INTRAMUSCULAR | Status: AC

## 2013-04-20 NOTE — Progress Notes (Signed)
Subjective:     Patient ID: Rebecca Strong, female   DOB: 1955/06/06, 58 y.o.   MRN: [id number]  HPI patient states her heel pain is back in both feet. States she had 5-6 months of relief   Review of Systems     Objective:   Physical Exam Neurovascular status intact with pain in the plantar heels of both feet    Assessment:     Plantar fasciitis reoccurring heel region both feet    Plan:     Injected the plantar fascia bilateral 3 mg Kenalog 5 mg I can Marcaine mixture and advised him reduced activity for several days

## 2013-07-27 ENCOUNTER — Ambulatory Visit (INDEPENDENT_AMBULATORY_CARE_PROVIDER_SITE_OTHER): Payer: Self-pay | Admitting: Podiatry

## 2013-07-27 ENCOUNTER — Ambulatory Visit: Payer: Self-pay | Admitting: Podiatry

## 2013-07-27 ENCOUNTER — Encounter: Payer: Self-pay | Admitting: Podiatry

## 2013-07-27 VITALS — BP 140/78 | HR 75 | Resp 16

## 2013-07-27 DIAGNOSIS — M722 Plantar fascial fibromatosis: Secondary | ICD-10-CM

## 2013-07-27 MED ORDER — TRIAMCINOLONE ACETONIDE 10 MG/ML IJ SUSP
10.0000 mg | Freq: Once | INTRAMUSCULAR | Status: AC
  Administered 2013-07-27: 10 mg

## 2013-07-27 NOTE — Progress Notes (Signed)
Subjective:     Patient ID: Rebecca Strong, female   DOB: 10/16/1955, 58 y.o.   MRN: [id number]  HPI patient presents for injections of both heels   Review of Systems     Objective:   Physical Exam Discomfort in the plantar heel region of both heel    Assessment:     Plantar fasciitis bilateral    Plan:     Injected 3 mg Kenalog 5 mg Xylocaine Marcaine mixture into each heel

## 2013-12-13 ENCOUNTER — Ambulatory Visit (INDEPENDENT_AMBULATORY_CARE_PROVIDER_SITE_OTHER): Payer: Self-pay | Admitting: Podiatry

## 2013-12-13 ENCOUNTER — Encounter: Payer: Self-pay | Admitting: Podiatry

## 2013-12-13 VITALS — BP 170/88 | HR 82 | Resp 16

## 2013-12-13 DIAGNOSIS — M722 Plantar fascial fibromatosis: Secondary | ICD-10-CM

## 2013-12-13 MED ORDER — DICLOFENAC SODIUM 75 MG PO TBEC: 75.0000 mg | DELAYED_RELEASE_TABLET | Freq: Two times a day (BID) | ORAL | Status: DC

## 2013-12-13 MED ORDER — TRIAMCINOLONE ACETONIDE 10 MG/ML IJ SUSP
10.0000 mg | Freq: Once | INTRAMUSCULAR | Status: AC
  Administered 2013-12-13: 10 mg

## 2013-12-13 NOTE — Progress Notes (Signed)
Subjective:     Patient ID: Rebecca Strong, female   DOB: [birth year], 58 y.o.   MRN: [id number]  HPI patient presents stating that my heels are starting to hurt time due to go to [place]. Also concerned about the possibility for neuroma between the second and third toes on the left foot   Review of Systems     Objective:   Physical Exam Neurovascular status intact with pain to palpation medial fascially band of both heels and minimal discomfort currently in the second interspace left    Assessment:     Plantar fasciitis bilateral with possible neuroma versus capsulitis left second and third interspace    Plan:     Reviewed condition and today injected the plantar fascia of both heels 3 mg Kenalog 5 mg Xylocaine and prescribed diclofenac 75 mg #180. Will evaluate neuroma symptoms if they persist

## 2014-06-11 ENCOUNTER — Ambulatory Visit (INDEPENDENT_AMBULATORY_CARE_PROVIDER_SITE_OTHER): Payer: Self-pay | Admitting: Podiatry

## 2014-06-11 ENCOUNTER — Encounter: Payer: Self-pay | Admitting: Podiatry

## 2014-06-11 DIAGNOSIS — M722 Plantar fascial fibromatosis: Secondary | ICD-10-CM

## 2014-06-11 MED ORDER — TRIAMCINOLONE ACETONIDE 10 MG/ML IJ SUSP
10.0000 mg | Freq: Once | INTRAMUSCULAR | Status: AC
  Administered 2014-06-11: 10 mg

## 2014-06-11 NOTE — Progress Notes (Signed)
Subjective:     Patient ID: Rebecca Strong, female   DOB: May 14, 1955, 59 y.o.   MRN: [id number]  HPI patient presents with pain in the heels of both feet at the insertion of the tendon into the calcaneus   Review of Systems     Objective:   Physical Exam Neurovascular status intact with exquisite discomfort plantar heel bilateral    Assessment:     Plantar fasciitis of the heel bilateral    Plan:     Reinjected the plantar fascia 3 [place] Kenalog 5 mg Xylocaine bilateral and instructed on physical therapy and see back as needed

## 2014-11-19 ENCOUNTER — Encounter: Payer: Self-pay | Admitting: Emergency Medicine

## 2014-11-19 ENCOUNTER — Emergency Department
Admission: EM | Admit: 2014-11-19 | Discharge: 2014-11-19 | Disposition: A | Discharge status: Home / Self Care | Payer: PRIVATE HEALTH INSURANCE | Attending: Emergency Medicine | Admitting: Emergency Medicine

## 2014-11-19 DIAGNOSIS — Y9389 Activity, other specified: Secondary | ICD-10-CM | POA: Diagnosis not present

## 2014-11-19 DIAGNOSIS — Z79899 Other long term (current) drug therapy: Secondary | ICD-10-CM | POA: Insufficient documentation

## 2014-11-19 DIAGNOSIS — S39012A Strain of muscle, fascia and tendon of lower back, initial encounter: Secondary | ICD-10-CM

## 2014-11-19 DIAGNOSIS — X58XXXA Exposure to other specified factors, initial encounter: Secondary | ICD-10-CM | POA: Insufficient documentation

## 2014-11-19 DIAGNOSIS — S3992XA Unspecified injury of lower back, initial encounter: Secondary | ICD-10-CM | POA: Diagnosis present

## 2014-11-19 DIAGNOSIS — J029 Acute pharyngitis, unspecified: Secondary | ICD-10-CM

## 2014-11-19 DIAGNOSIS — Y9289 Other specified places as the place of occurrence of the external cause: Secondary | ICD-10-CM | POA: Diagnosis not present

## 2014-11-19 DIAGNOSIS — Y998 Other external cause status: Secondary | ICD-10-CM | POA: Insufficient documentation

## 2014-11-19 HISTORY — DX: Plantar fascial fibromatosis: M72.2

## 2014-11-19 MED ORDER — AZITHROMYCIN 250 MG PO TABS: ORAL_TABLET | ORAL | Status: DC

## 2014-11-19 MED ORDER — BACLOFEN 10 MG PO TABS: 10.0000 mg | ORAL_TABLET | Freq: Three times a day (TID) | ORAL | Status: DC

## 2014-11-19 MED ORDER — KETOROLAC TROMETHAMINE 60 MG/2ML IM SOLN
60.0000 mg | Freq: Once | INTRAMUSCULAR | Status: AC
  Administered 2014-11-19: 60 mg via INTRAMUSCULAR
  Filled 2014-11-19: qty 2

## 2014-11-19 MED ORDER — IBUPROFEN 800 MG PO TABS: 800.0000 mg | ORAL_TABLET | Freq: Three times a day (TID) | ORAL | Status: DC | PRN

## 2014-11-19 NOTE — Discharge Instructions (Signed)
Back Pain, Adult Low back pain is very common. About 1 in 5 people have back pain.The cause of low back pain is rarely dangerous. The pain often gets better over time.About half of people with a sudden onset of back pain feel better in just 2 weeks. About 8 in 10 people feel better by 6 weeks.  CAUSES Some common causes of back pain include:  Strain of the muscles or ligaments supporting the spine.  Wear and tear (degeneration) of the spinal discs.  Arthritis.  Direct injury to the back. DIAGNOSIS Most of the time, the direct cause of low back pain is not known.However, back pain can be treated effectively even when the exact cause of the pain is unknown.Answering your caregiver's questions about your overall health and symptoms is one of the most accurate ways to make sure the cause of your pain is not dangerous. If your caregiver needs more information, he or she may order lab work or imaging tests (X-rays or MRIs).However, even if imaging tests show changes in your back, this usually does not require surgery. HOME CARE INSTRUCTIONS For many people, back pain returns.Since low back pain is rarely dangerous, it is often a condition that people can learn to [facility] their own.   Remain active. It is stressful on the back to sit or stand in one place. Do not sit, drive, or stand in one place for more than 30 minutes at a time. Take short walks on level surfaces as soon as pain allows.Try to increase the length of time you walk each day.  Do not stay in bed.Resting more than 1 or 2 days can delay your recovery.  Do not avoid exercise or work.Your body is made to move.It is not dangerous to be active, even though your back may hurt.Your back will likely heal faster if you return to being active before your pain is gone.  Pay attention to your body when you bend and lift. Many people have less discomfortwhen lifting if they bend their knees, keep the load close to their bodies,and  avoid twisting. Often, the most comfortable positions are those that put less stress on your recovering back.  Find a comfortable position to sleep. Use a firm mattress and lie on your side with your knees slightly bent. If you lie on your back, put a pillow under your knees.  Only take over-the-counter or prescription medicines as directed by your caregiver. Over-the-counter medicines to reduce pain and inflammation are often the most helpful.Your caregiver may prescribe muscle relaxant drugs.These medicines help dull your pain so you can more quickly return to your normal activities and healthy exercise.  Put ice on the injured area.  Put ice in a plastic bag.  Place a towel between your skin and the bag.  Leave the ice on for 15-20 minutes, 03-04 times a day for the first 2 to 3 days. After that, ice and heat may be alternated to reduce pain and spasms.  Ask your caregiver about trying back exercises and gentle massage. This may be of some benefit.  Avoid feeling anxious or stressed.Stress increases muscle tension and can worsen back pain.It is important to recognize when you are anxious or stressed and learn ways to manage it.Exercise is a great option. SEEK MEDICAL CARE IF:  You have pain that is not relieved with rest or medicine.  You have pain that does not improve in 1 week.  You have new symptoms.  You are generally not feeling well. SEEK  IMMEDIATE MEDICAL CARE IF:   You have pain that radiates from your back into your legs.  You develop new bowel or bladder control problems.  You have unusual weakness or numbness in your arms or legs.  You develop nausea or vomiting.  You develop abdominal pain.  You feel faint. Document Released: 02/23/2005 Document Revised: 08/25/2011 Document Reviewed: 06/27/2013 [facility] Patient Information 2015 [place], [place]. This information is not intended to replace advice given to you by your health care provider. Make sure you  discuss any questions you have with your health care provider.  Sore Throat A sore throat is pain, burning, irritation, or scratchiness of the throat. There is often pain or tenderness when swallowing or talking. A sore throat may be accompanied by other symptoms, such as coughing, sneezing, fever, and swollen neck glands. A sore throat is often the first sign of another sickness, such as a cold, flu, strep throat, or mononucleosis (commonly known as mono). Most sore throats go away without medical treatment. CAUSES  The most common causes of a sore throat include:  A viral infection, such as a cold, flu, or mono.  A bacterial infection, such as strep throat, tonsillitis, or whooping cough.  Seasonal allergies.  Dryness in the air.  Irritants, such as smoke or pollution.  Gastroesophageal reflux disease (GERD). HOME CARE INSTRUCTIONS   Only take over-the-counter medicines as directed by your caregiver.  Drink enough fluids to keep your urine clear or pale yellow.  Rest as needed.  Try using throat sprays, lozenges, or sucking on hard candy to ease any pain (if older than 4 years or as directed).  Sip warm liquids, such as broth, herbal tea, or warm water with honey to relieve pain temporarily. You may also eat or drink cold or frozen liquids such as frozen ice pops.  Gargle with salt water (mix 1 tsp salt with 8 oz of water).  Do not smoke and avoid secondhand smoke.  Put a cool-mist humidifier in your bedroom at night to moisten the air. You can also turn on a hot shower and sit in the bathroom with the door closed for 5-10 minutes. SEEK IMMEDIATE MEDICAL CARE IF:  You have difficulty breathing.  You are unable to swallow fluids, soft foods, or your saliva.  You have increased swelling in the throat.  Your sore throat does not get better in 7 days.  You have nausea and vomiting.  You have a fever or persistent symptoms for more than 2-3 days.  You have a fever and  your symptoms suddenly get worse. MAKE SURE YOU:   Understand these instructions.  Will watch your condition.  Will get help right away if you are not doing well or get worse. Document Released: 04/02/2004 Document Revised: 02/10/2012 Document Reviewed: 11/01/2011 [facility] Patient Information 2015 [place], [place]. This information is not intended to replace advice given to you by your health care provider. Make sure you discuss any questions you have with your health care provider.

## 2014-11-19 NOTE — ED Notes (Signed)
Lower back pain for several days ..worse yesterday   Radiates into left leg..scratchty throat this am

## 2014-11-19 NOTE — ED Notes (Signed)
Reports pain in lower back rad down left leg.  States "while im here i also have a junky throat".  Pt ambulates well, no resp distress.

## 2014-11-19 NOTE — ED Provider Notes (Addendum)
[facility] Emergency Department Provider Note  ____________________________________________  Time seen: Approximately 11:07 AM  I have reviewed the triage vital signs and the nursing notes.   HISTORY  Chief Complaint Back Pain    HPI Rebecca Strong is a 59 y.o. female presents for evaluation of low back pain times several days. Worse this morning patient states that the spasms kick and she really has to go to the floor. In addition while she is here patient states that she's been having increased pain with swallowing and noticed that she is coughing up productive yellow sputum. Eyes any fever or chills. Denies any trauma. Denies any recent lifting or exertion's.   Past Medical History  Diagnosis Date  . Plantar fasciitis     There are no active problems to display for this patient.   Past Surgical History  Procedure Laterality Date  . Shoulder surgery      Current Outpatient Rx  Name  Route  Sig  Dispense  Refill  . azithromycin (ZITHROMAX Z-PAK) 250 MG tablet      Take 2 tablets (500 mg) on  Day 1,  followed by 1 tablet (250 mg) once daily on Days 2 through 5.   6 each   0   . baclofen (LIORESAL) 10 MG tablet   Oral   Take 1 tablet (10 mg total) by mouth 3 (three) times daily.   30 tablet   0   . Furosemide (LASIX PO)   Oral   Take by mouth.         Marland Kitchen ibuprofen (ADVIL,MOTRIN) 800 MG tablet   Oral   Take 1 tablet (800 mg total) by mouth every 8 (eight) hours as needed.   30 tablet   0     Allergies Review of patient's allergies indicates no known allergies.  History reviewed. No pertinent family history.  Social History Social History  Substance Use Topics  . Smoking status: Never Smoker   . Smokeless tobacco: None  . Alcohol Use: 0.0 oz/week    0 Standard drinks or equivalent per week    Review of Systems Constitutional: No fever/chills Eyes: No visual changes. ENT: Positive for sore throat Cardiovascular:  Denies chest pain. Respiratory: Denies shortness of breath. Gastrointestinal: No abdominal pain.  No nausea, no vomiting.  No diarrhea.  No constipation. Genitourinary: Negative for dysuria. Musculoskeletal: Positive for low back pain Skin: Negative for rash. Neurological: Negative for headaches, focal weakness or numbness.  10-point ROS otherwise negative.  ____________________________________________   PHYSICAL EXAM:  VITAL SIGNS: ED Triage Vitals  Enc Vitals Group     BP 11/19/14 1022 150/86 mmHg     Pulse Rate 11/19/14 1022 83     Resp 11/19/14 1022 18     Temp 11/19/14 1022 99.3 F (37.4 C)     Temp Source 11/19/14 1022 Oral     SpO2 11/19/14 1022 97 %     Weight 11/19/14 1022 200 lb (90.719 kg)     Height 11/19/14 1022 5' 9"  (1.753 m)     Head Cir --      Peak Flow --      Pain Score 11/19/14 1023 5     Pain Loc --      Pain Edu? --      Excl. in [place]? --     Constitutional: Alert and oriented. Well appearing and in no acute distress. Eyes: Conjunctivae are normal. PERRL. EOMI. Head: Atraumatic. Nose: No congestion/rhinnorhea. Mouth/Throat: Mucous membranes  are moist.  Oropharynx non-erythematous positive yellow mucous drainage noted posterior pharynx Neck: No stridor.   Cardiovascular: Normal rate, regular rhythm. Grossly normal heart sounds.  Good peripheral circulation. Respiratory: Normal respiratory effort.  No retractions. Lungs CTAB. Musculoskeletal: No lower extremity tenderness nor edema.  No joint effusions. Trachea leg raise negative. We tenderness to the left upper gluteal area. Neurologic:  Normal speech and language. No gross focal neurologic deficits are appreciated. No gait instability. Skin:  Skin is warm, dry and intact. No rash noted. Psychiatric: Mood and affect are normal. Speech and behavior are normal.  ____________________________________________   LABS (all labs ordered are listed, but only abnormal results are displayed)  Labs  Reviewed - No data to display ____________________________________________    PROCEDURES  Procedure(s) performed: None  Critical Care performed: No  ____________________________________________   INITIAL IMPRESSION / ASSESSMENT AND PLAN / ED COURSE  Pertinent labs & imaging results that were available during my care of the patient were reviewed by me and considered in my medical decision making (see chart for details).  Acute low back pain. Acute pharyngitis. Rx given for baclofen 10 mg 3 times a day, Motrin 800 mg 3 times a day. Toradol 60 mg IM given while in the ER. Patient started on Z-Pak. Patient voices no other emergency medical complaints at this time ____________________________________________   FINAL CLINICAL IMPRESSION(S) / ED DIAGNOSES  Final diagnoses:  Low back strain, initial encounter  Pharyngitis      Arlyss Repress, PA-C 11/19/14 [place], MD 11/19/14 [place], PA-C 11/19/14 1317  Daymon Larsen, MD 11/19/14 1359

## 2015-02-22 ENCOUNTER — Ambulatory Visit (INDEPENDENT_AMBULATORY_CARE_PROVIDER_SITE_OTHER): Payer: Self-pay | Admitting: Podiatry

## 2015-02-22 ENCOUNTER — Encounter: Payer: Self-pay | Admitting: Podiatry

## 2015-02-22 VITALS — BP 136/96 | HR 88 | Resp 12

## 2015-02-22 DIAGNOSIS — M722 Plantar fascial fibromatosis: Secondary | ICD-10-CM

## 2015-02-22 MED ORDER — TRIAMCINOLONE ACETONIDE 10 MG/ML IJ SUSP
10.0000 mg | Freq: Once | INTRAMUSCULAR | Status: AC
  Administered 2015-02-22: 10 mg

## 2015-02-22 MED ORDER — MELOXICAM 15 MG PO TABS: 15.0000 mg | ORAL_TABLET | Freq: Every day | ORAL | Status: AC

## 2015-02-22 NOTE — Progress Notes (Signed)
Subjective:     Patient ID: Rebecca Strong, female   DOB: 1955/12/05, 59 y.o.   MRN: [id number]  HPI patient states that the heels on both my feet are really hurting and making it hard for me to walk   Review of Systems     Objective:   Physical Exam Neurovascular status intact muscle strength adequate with exquisite discomfort plantar aspect of the heel bilateral insertion tendon calcaneus    Assessment:     Plantar fasciitis reoccurring bilateral with an approximate 8 months spread between visits    Plan:     Switch patient to mobilize at this time and injected the plantar fascia bilateral 3 mg Kenalog 5 mg Xylocaine and advised on supportive shoes stretching exercises physical therapy

## 2016-08-15 ENCOUNTER — Emergency Department
Admission: EM | Admit: 2016-08-15 | Discharge: 2016-08-15 | Disposition: A | Discharge status: Home / Self Care | Payer: 59 | Attending: Emergency Medicine | Admitting: Emergency Medicine

## 2016-08-15 ENCOUNTER — Encounter: Payer: Self-pay | Admitting: Emergency Medicine

## 2016-08-15 DIAGNOSIS — L259 Unspecified contact dermatitis, unspecified cause: Secondary | ICD-10-CM

## 2016-08-15 DIAGNOSIS — R21 Rash and other nonspecific skin eruption: Secondary | ICD-10-CM | POA: Diagnosis present

## 2016-08-15 DIAGNOSIS — Z79899 Other long term (current) drug therapy: Secondary | ICD-10-CM | POA: Diagnosis not present

## 2016-08-15 MED ORDER — FAMOTIDINE 20 MG PO TABS
20.0000 mg | ORAL_TABLET | Freq: Once | ORAL | Status: AC
  Administered 2016-08-15: 20 mg via ORAL
  Filled 2016-08-15: qty 1

## 2016-08-15 MED ORDER — RANITIDINE HCL 150 MG PO TABS: 150.0000 mg | ORAL_TABLET | Freq: Two times a day (BID) | ORAL | 0 refills | Status: DC

## 2016-08-15 MED ORDER — DIPHENHYDRAMINE HCL 50 MG/ML IJ SOLN
25.0000 mg | Freq: Once | INTRAMUSCULAR | Status: AC
  Administered 2016-08-15: 25 mg via INTRAMUSCULAR
  Filled 2016-08-15: qty 1

## 2016-08-15 MED ORDER — METHYLPREDNISOLONE SODIUM SUCC 125 MG IJ SOLR
125.0000 mg | Freq: Once | INTRAMUSCULAR | Status: AC
  Administered 2016-08-15: 125 mg via INTRAMUSCULAR
  Filled 2016-08-15: qty 2

## 2016-08-15 MED ORDER — PREDNISONE 20 MG PO TABS: ORAL_TABLET | ORAL | 0 refills | Status: AC

## 2016-08-15 NOTE — ED Provider Notes (Signed)
[facility] Emergency Department Provider Note  ____________________________________________  Time seen: Approximately 2:06 PM  I have reviewed the triage vital signs and the nursing notes.   HISTORY  Chief Complaint Rash    HPI Rebecca Strong is a 61 y.o. female presents to the emergency department with rash over bilateral arms and back for one week. Patient states that she touched some poison oak with her left hand and the rash has continued to spread.Rash itches. She denies fever, shortness breath, chest pain, nausea, vomiting, abdominal pain.   Past Medical History:  Diagnosis Date  . Plantar fasciitis     There are no active problems to display for this patient.   Past Surgical History:  Procedure Laterality Date  . SHOULDER SURGERY      Prior to Admission medications   Medication Sig Start Date End Date Taking? Authorizing Provider  azithromycin (ZITHROMAX Z-PAK) 250 MG tablet Take 2 tablets (500 mg) on  Day 1,  followed by 1 tablet (250 mg) once daily on Days 2 through 5. 11/19/14   Beers, Pierce Crane, PA-C  baclofen (LIORESAL) 10 MG tablet Take 1 tablet (10 mg total) by mouth 3 (three) times daily. 11/19/14   Beers, Pierce Crane, PA-C  Furosemide (LASIX PO) Take by mouth.    [provider]  ibuprofen (ADVIL,MOTRIN) 800 MG tablet Take 1 tablet (800 mg total) by mouth every 8 (eight) hours as needed. 11/19/14   Beers, Pierce Crane, PA-C  meloxicam (MOBIC) 15 MG tablet Take 1 tablet (15 mg total) by mouth daily. 02/22/15   Wallene Huh, DPM  predniSONE (DELTASONE) 20 MG tablet Take 3 tablets by mouth for days 1-6. Take 2 tablets by mouth for days 7-12. Take 1 tablet by mouth for days 13-18. 08/15/16 09/01/16  Laban Emperor, PA-C  ranitidine (ZANTAC) 150 MG tablet Take 1 tablet (150 mg total) by mouth 2 (two) times daily. 08/15/16 08/15/17  Laban Emperor, PA-C    Allergies Patient has no known allergies.  No family history on file.  Social  History Social History  Substance Use Topics  . Smoking status: Never Smoker  . Smokeless tobacco: Not on file  . Alcohol use 0.0 oz/week     Review of Systems  Constitutional: No fever/chills Cardiovascular: No chest pain. Respiratory: No SOB. Gastrointestinal: No abdominal pain.  No nausea, no vomiting.  Musculoskeletal: Negative for musculoskeletal pain. Skin: Negative for abrasions, lacerations, ecchymosis. Positive for rash. Neurological: Negative for headaches, numbness or tingling   ____________________________________________   PHYSICAL EXAM:  VITAL SIGNS: ED Triage Vitals  Enc Vitals Group     BP 08/15/16 1230 (!) 163/94     Pulse Rate 08/15/16 1230 79     Resp 08/15/16 1230 20     Temp 08/15/16 1230 98.3 F (36.8 C)     Temp Source 08/15/16 1230 Oral     SpO2 08/15/16 1230 98 %     Weight 08/15/16 1231 195 lb (88.5 kg)     Height 08/15/16 1231 5' 8"  (1.727 m)     Head Circumference --      Peak Flow --      Pain Score 08/15/16 1230 5     Pain Loc --      Pain Edu? --      Excl. in [place]? --      Constitutional: Alert and oriented. Well appearing and in no acute distress. Eyes: Conjunctivae are normal. PERRL. EOMI. Head: Atraumatic. ENT:  Ears:      Nose: No congestion/rhinnorhea.      Mouth/Throat: Mucous membranes are moist.  Neck: No stridor. Cardiovascular: Normal rate, regular rhythm.  Good peripheral circulation. Respiratory: Normal respiratory effort without tachypnea or retractions. Lungs CTAB. Good air entry to the bases with no decreased or absent breath sounds. Musculoskeletal: Full range of motion to all extremities. No gross deformities appreciated. Neurologic:  Normal speech and language. No gross focal neurologic deficits are appreciated.  Skin:  Skin is warm, dry. No rash noted. Clustered wheals to bilateral arms and back. No surrounding erythema or drainage.   ____________________________________________   LABS (all labs  ordered are listed, but only abnormal results are displayed)  Labs Reviewed - No data to display ____________________________________________  EKG   ____________________________________________  RADIOLOGY  No results found.  ____________________________________________    PROCEDURES  Procedure(s) performed:    Procedures    Medications  methylPREDNISolone sodium succinate (SOLU-MEDROL) 125 mg/2 mL injection 125 mg (125 mg Intramuscular Given 08/15/16 1410)  diphenhydrAMINE (BENADRYL) injection 25 mg (25 mg Intramuscular Given 08/15/16 1410)  famotidine (PEPCID) tablet 20 mg (20 mg Oral Given 08/15/16 1409)     ____________________________________________   INITIAL IMPRESSION / ASSESSMENT AND PLAN / ED COURSE  Pertinent labs & imaging results that were available during my care of the patient were reviewed by me and considered in my medical decision making (see chart for details).  Review of the [place] CSRS was performed in accordance of the [place] prior to dispensing any controlled drugs.     Patient's diagnosis is consistent with contact dermatitis. Vital signs and exam are reassuring. She was given Solu-Medrol, IM Benadryl, Pepcid in ED. Patient will be discharged home with prescriptions for prednisone and ranitidine. Patient is to follow up with PCP as directed. Patient is given ED precautions to return to the ED for any worsening or new symptoms.     ____________________________________________  FINAL CLINICAL IMPRESSION(S) / ED DIAGNOSES  Final diagnoses:  Contact dermatitis, unspecified contact dermatitis type, unspecified trigger      NEW MEDICATIONS STARTED DURING THIS VISIT:  Discharge Medication List as of 08/15/2016  2:15 PM    START taking these medications   Details  predniSONE (DELTASONE) 20 MG tablet Take 3 tablets by mouth for days 1-6. Take 2 tablets by mouth for days 7-12. Take 1 tablet by mouth for days 13-18., Print    ranitidine (ZANTAC) 150  MG tablet Take 1 tablet (150 mg total) by mouth 2 (two) times daily., Starting Sat 08/15/2016, Until Sun 08/15/2017, Print            This chart was dictated using voice recognition software/Dragon. Despite best efforts to proofread, errors can occur which can change the meaning. Any change was purely unintentional.    Laban Emperor, PA-C 08/15/16 1615    Lisa Roca, MD 08/16/16 [phone]

## 2016-08-15 NOTE — ED Triage Notes (Signed)
Rash x 1 week, thinks was exposed to poison ivy.

## 2018-03-19 ENCOUNTER — Encounter: Payer: Self-pay | Admitting: Emergency Medicine

## 2018-03-19 ENCOUNTER — Other Ambulatory Visit: Payer: Self-pay

## 2018-03-19 ENCOUNTER — Emergency Department
Admission: EM | Admit: 2018-03-19 | Discharge: 2018-03-20 | Disposition: A | Discharge status: Home / Self Care | Payer: 59 | Attending: Emergency Medicine | Admitting: Emergency Medicine

## 2018-03-19 DIAGNOSIS — Z79899 Other long term (current) drug therapy: Secondary | ICD-10-CM | POA: Insufficient documentation

## 2018-03-19 DIAGNOSIS — R310 Gross hematuria: Secondary | ICD-10-CM | POA: Insufficient documentation

## 2018-03-19 DIAGNOSIS — N3091 Cystitis, unspecified with hematuria: Secondary | ICD-10-CM | POA: Insufficient documentation

## 2018-03-19 LAB — BASIC METABOLIC PANEL
Anion gap: 11 (ref 5–15)
BUN: 20 mg/dL (ref 8–23)
CALCIUM: 8.8 mg/dL — AB (ref 8.9–10.3)
CO2: 25 mmol/L (ref 22–32)
CREATININE: 0.86 mg/dL (ref 0.44–1.00)
Chloride: 102 mmol/L (ref 98–111)
GFR calc Af Amer: >60 mL/min (ref >60)
GFR calc non Af Amer: >60 mL/min (ref >60)
GLUCOSE: 115 mg/dL — AB (ref 70–99)
Potassium: 4.5 mmol/L (ref 3.5–5.1)
Sodium: 138 mmol/L (ref 135–145)

## 2018-03-19 LAB — CBC
HEMATOCRIT: 43.8 % (ref 36.0–46.0)
Hemoglobin: 14.1 g/dL (ref 12.0–15.0)
MCH: 27.9 pg (ref 26.0–34.0)
MCHC: 32.2 g/dL (ref 30.0–36.0)
MCV: 86.7 fL (ref 80.0–100.0)
Platelets: 237 10*3/uL (ref 150–400)
RBC: 5.05 MIL/uL (ref 3.87–5.11)
RDW: 13.7 % (ref 11.5–15.5)
WBC: 6 10*3/uL (ref 4.0–10.5)
nRBC: 0 % (ref 0.0–0.2)

## 2018-03-19 LAB — URINALYSIS, COMPLETE (UACMP) WITH MICROSCOPIC
Bacteria, UA: NONE SEEN
SQUAMOUS EPITHELIAL / LPF: NONE SEEN (ref 0–5)
Specific Gravity, Urine: 1.019 (ref 1.005–1.030)

## 2018-03-19 MED ORDER — CEPHALEXIN 500 MG PO CAPS
500.0000 mg | ORAL_CAPSULE | Freq: Once | ORAL | Status: AC
  Administered 2018-03-20: 500 mg via ORAL
  Filled 2018-03-19: qty 1

## 2018-03-19 MED ORDER — CEPHALEXIN 500 MG PO CAPS: 500.0000 mg | ORAL_CAPSULE | Freq: Two times a day (BID) | ORAL | 0 refills | Status: AC

## 2018-03-19 NOTE — ED Provider Notes (Signed)
[facility] Emergency Department Provider Note ____________________________________________   First MD Initiated Contact with Patient 03/19/18 2310     (approximate)  I have reviewed the triage vital signs and the nursing notes.   HISTORY  Chief Complaint Hematuria    HPI Rebecca Strong is a 63 y.o. female with PMH as noted below who presents with hematuria, described as frank blood with clots, acute onset this afternoon, and now somewhat subsiding.  She states that over the last few days she has had some discomfort at the end of urination which she describes as almost like an electric sensation in her lower abdomen.  She denies any active abdominal or flank pain.  She denies vomiting, fever, or other acute symptoms.  She has no significant UTI history and states that her last bladder infection was when she was a child.  She denies any other abnormal bleeding or bruising.  Past Medical History:  Diagnosis Date  . Plantar fasciitis     There are no active problems to display for this patient.   Past Surgical History:  Procedure Laterality Date  . SHOULDER SURGERY      Prior to Admission medications   Medication Sig Start Date End Date Taking? Authorizing Provider  azithromycin (ZITHROMAX Z-PAK) 250 MG tablet Take 2 tablets (500 mg) on  Day 1,  followed by 1 tablet (250 mg) once daily on Days 2 through 5. 11/19/14   Beers, Pierce Crane, PA-C  baclofen (LIORESAL) 10 MG tablet Take 1 tablet (10 mg total) by mouth 3 (three) times daily. 11/19/14   Beers, Pierce Crane, PA-C  cephALEXin (KEFLEX) 500 MG capsule Take 1 capsule (500 mg total) by mouth 2 (two) times daily for 10 days. 03/20/18 03/30/18  Arta Silence, MD  Furosemide (LASIX PO) Take by mouth.    [provider]  ibuprofen (ADVIL,MOTRIN) 800 MG tablet Take 1 tablet (800 mg total) by mouth every 8 (eight) hours as needed. 11/19/14   Beers, Pierce Crane, PA-C  meloxicam (MOBIC) 15 MG tablet Take  1 tablet (15 mg total) by mouth daily. 02/22/15   Wallene Huh, DPM  ranitidine (ZANTAC) 150 MG tablet Take 1 tablet (150 mg total) by mouth 2 (two) times daily. 08/15/16 08/15/17  Laban Emperor, PA-C    Allergies Patient has no known allergies.  History reviewed. No pertinent family history.  Social History Social History   Tobacco Use  . Smoking status: Never Smoker  . Smokeless tobacco: Never Used  Substance Use Topics  . Alcohol use: Yes    Alcohol/week: 0.0 standard drinks  . Drug use: Not on file    Review of Systems  Constitutional: No fever. Eyes: No redness. ENT: No sore throat. Cardiovascular: Denies chest pain. Respiratory: Denies shortness of breath. Gastrointestinal: No vomiting or diarrhea.  Genitourinary: Positive for hematuria.  Musculoskeletal: Negative for back pain. Skin: Negative for rash. Neurological: Negative for headache.   ____________________________________________   PHYSICAL EXAM:  VITAL SIGNS: ED Triage Vitals [03/19/18 1611]  Enc Vitals Group     BP (!) 156/74     Pulse Rate 74     Resp 18     Temp 97.8 F (36.6 C)     Temp Source Oral     SpO2 96 %     Weight 205 lb (93 kg)     Height 5' 9"  (1.753 m)     Head Circumference      Peak Flow  Pain Score 0     Pain Loc      Pain Edu?      Excl. in [place]?     Constitutional: Alert and oriented. Well appearing and in no acute distress. Eyes: Conjunctivae are normal.  Head: Atraumatic. Nose: No congestion/rhinnorhea. Mouth/Throat: Mucous membranes are moist.   Neck: Normal range of motion.  Cardiovascular: Good peripheral circulation. Respiratory: Normal respiratory effort.  Gastrointestinal: Soft and nontender. No distention.  Genitourinary: No CVA or flank tenderness. Musculoskeletal: Extremities warm and well perfused.  Neurologic:  Normal speech and language. No gross focal neurologic deficits are appreciated.  Skin:  Skin is warm and dry. No rash noted. Psychiatric:  Mood and affect are normal. Speech and behavior are normal.  ____________________________________________   LABS (all labs ordered are listed, but only abnormal results are displayed)  Labs Reviewed  URINALYSIS, COMPLETE (UACMP) WITH MICROSCOPIC - Abnormal; Notable for the following components:      Result Value   Color, Urine RED (*)    APPearance BLOODY (*)    Glucose, UA   (*)    Value: TEST NOT REPORTED DUE TO COLOR INTERFERENCE OF URINE PIGMENT   Hgb urine dipstick   (*)    Value: TEST NOT REPORTED DUE TO COLOR INTERFERENCE OF URINE PIGMENT   Bilirubin Urine   (*)    Value: TEST NOT REPORTED DUE TO COLOR INTERFERENCE OF URINE PIGMENT   Ketones, ur   (*)    Value: TEST NOT REPORTED DUE TO COLOR INTERFERENCE OF URINE PIGMENT   Protein, ur   (*)    Value: TEST NOT REPORTED DUE TO COLOR INTERFERENCE OF URINE PIGMENT   Nitrite   (*)    Value: TEST NOT REPORTED DUE TO COLOR INTERFERENCE OF URINE PIGMENT   Leukocytes, UA   (*)    Value: TEST NOT REPORTED DUE TO COLOR INTERFERENCE OF URINE PIGMENT   RBC / HPF >50 (*)    WBC, UA >50 (*)    All other components within normal limits  BASIC METABOLIC PANEL - Abnormal; Notable for the following components:   Glucose, Bld 115 (*)    Calcium 8.8 (*)    All other components within normal limits  CBC   ____________________________________________  EKG  ____________________________________________  RADIOLOGY    ____________________________________________   PROCEDURES  Procedure(s) performed: No  Procedures  Critical Care performed: No ____________________________________________   INITIAL IMPRESSION / ASSESSMENT AND PLAN / ED COURSE  Pertinent labs & imaging results that were available during my care of the patient were reviewed by me and considered in my medical decision making (see chart for details).  63 year old female with PMH as noted above presents with acute onset of gross hematuria today with some  discomfort associated with urination over the last several days.  The patient denies abdominal, flank, or back pain, fever, or GI symptoms.  She has no other abnormal bleeding or bruising.  She is not on any anticoagulation.  On exam she is very well-appearing and her vital signs are normal except for hypertension and a borderline temperature.  The abdomen is soft and nontender.  The remainder of the exam is as described above.  Overall given the acute onset, the presence of urinary symptoms prior, and the low-grade temperature the presentation is most consistent with acute hemorrhagic cystitis.  The patient's lab work-up is reassuring.  I discussed the possibility of CT with her to evaluate for ureteral stones or bladder masses, however my suspicion for this  is lower and the patient states that she does not want to pursue this at this time.  We will treat empirically for urinary tract infection.  I have given the patient urology follow-up.  She feels comfortable and wants to go home.  I gave the patient the return precautions and she expressed understanding.   ____________________________________________   FINAL CLINICAL IMPRESSION(S) / ED DIAGNOSES  Final diagnoses:  Gross hematuria  Hemorrhagic cystitis      NEW MEDICATIONS STARTED DURING THIS VISIT:  New Prescriptions   CEPHALEXIN (KEFLEX) 500 MG CAPSULE    Take 1 capsule (500 mg total) by mouth 2 (two) times daily for 10 days.     Note:  This document was prepared using Dragon voice recognition software and may include unintentional dictation errors.    Arta Silence, MD 03/19/18 2342

## 2018-03-19 NOTE — ED Notes (Addendum)
Pt stated " my feet feel cold on the inside like a circulation thing". Pt also stated that she wasn't in any pain but only discomfort.

## 2018-03-19 NOTE — Discharge Instructions (Addendum)
Your bleeding is most likely due to a bladder infection, so we are treating you for this.  Take the antibiotic as prescribed and finish the full course.  You should follow-up with the urologist in approximately 1 to 2 weeks.  Return to the ER for new, worsening, or persistent blood in the urine, worsening pain, fever, vomiting or inability to take the medication, other abnormal bleeding or bruising, or any other new or worsening symptoms that concern you.

## 2018-03-19 NOTE — ED Triage Notes (Signed)
Pt here for large episode of hematuria with clots today. Has had pain after urination for a 2 weeks but hematuria first noted today.  No fevers. No flank pain.  Also reports after dinner for past week gets hives after dinner that go away with benadryl and then come back the next day after eats dinner. VSS. NAD. Ambulatory.

## 2018-03-20 NOTE — ED Notes (Signed)
No peripheral IV placed this visit.   Discharge instructions reviewed with patient. Questions fielded by this RN. Patient verbalizes understanding of instructions. Patient discharged home in stable condition per siadecki. No acute distress noted at time of discharge.

## 2018-03-31 ENCOUNTER — Ambulatory Visit: Payer: Self-pay | Admitting: Urology

## 2018-04-14 ENCOUNTER — Ambulatory Visit: Payer: Self-pay | Admitting: Urology

## 2019-08-28 ENCOUNTER — Other Ambulatory Visit: Payer: Self-pay | Admitting: Family Medicine

## 2019-08-28 DIAGNOSIS — Z1231 Encounter for screening mammogram for malignant neoplasm of breast: Secondary | ICD-10-CM

## 2019-08-28 DIAGNOSIS — E2839 Other primary ovarian failure: Secondary | ICD-10-CM

## 2019-12-07 ENCOUNTER — Other Ambulatory Visit (HOSPITAL_COMMUNITY): Payer: Self-pay | Admitting: Family Medicine

## 2019-12-07 ENCOUNTER — Other Ambulatory Visit: Payer: Self-pay | Admitting: Family Medicine

## 2019-12-07 DIAGNOSIS — H93A2 Pulsatile tinnitus, left ear: Secondary | ICD-10-CM

## 2019-12-11 ENCOUNTER — Other Ambulatory Visit: Payer: Self-pay

## 2019-12-11 ENCOUNTER — Ambulatory Visit
Admission: RE | Admit: 2019-12-11 | Discharge: 2019-12-11 | Disposition: A | Discharge status: Home / Self Care | Payer: Medicare Other | Source: Ambulatory Visit | Attending: Family Medicine | Admitting: Family Medicine

## 2019-12-11 DIAGNOSIS — E2839 Other primary ovarian failure: Secondary | ICD-10-CM

## 2019-12-11 DIAGNOSIS — Z1231 Encounter for screening mammogram for malignant neoplasm of breast: Secondary | ICD-10-CM

## 2019-12-11 IMAGING — MG DIGITAL SCREENING BILAT W/ CAD
4 series · 4 of 4 positions shown · non-contrast
Comparison: Previous exam(s).

CLINICAL DATA: Screening.

EXAM:
DIGITAL SCREENING BILATERAL MAMMOGRAM WITH CAD

[L MLO]
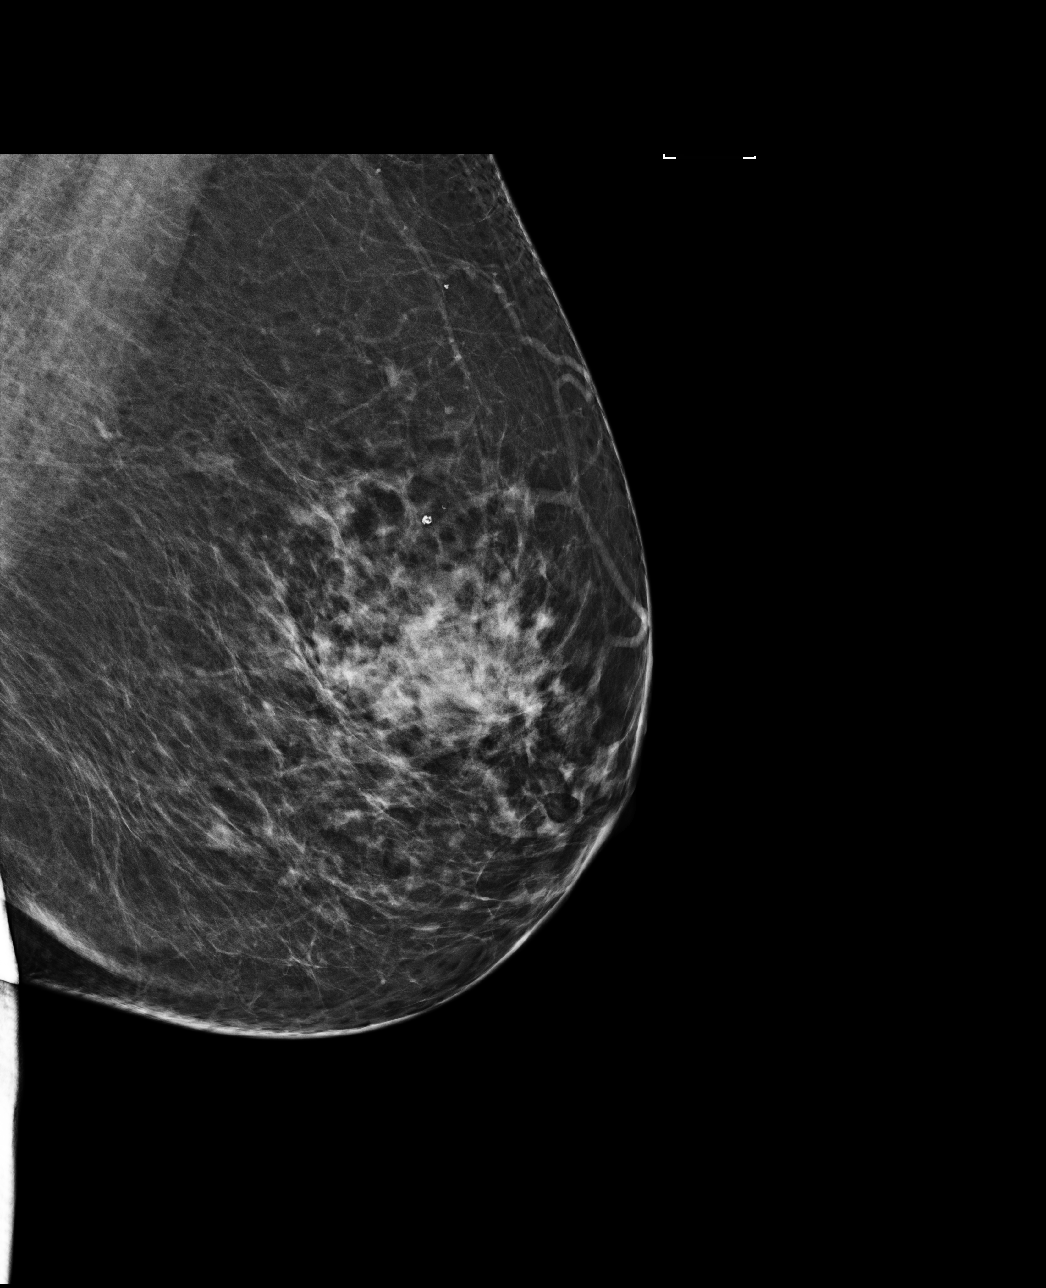

[R MLO]
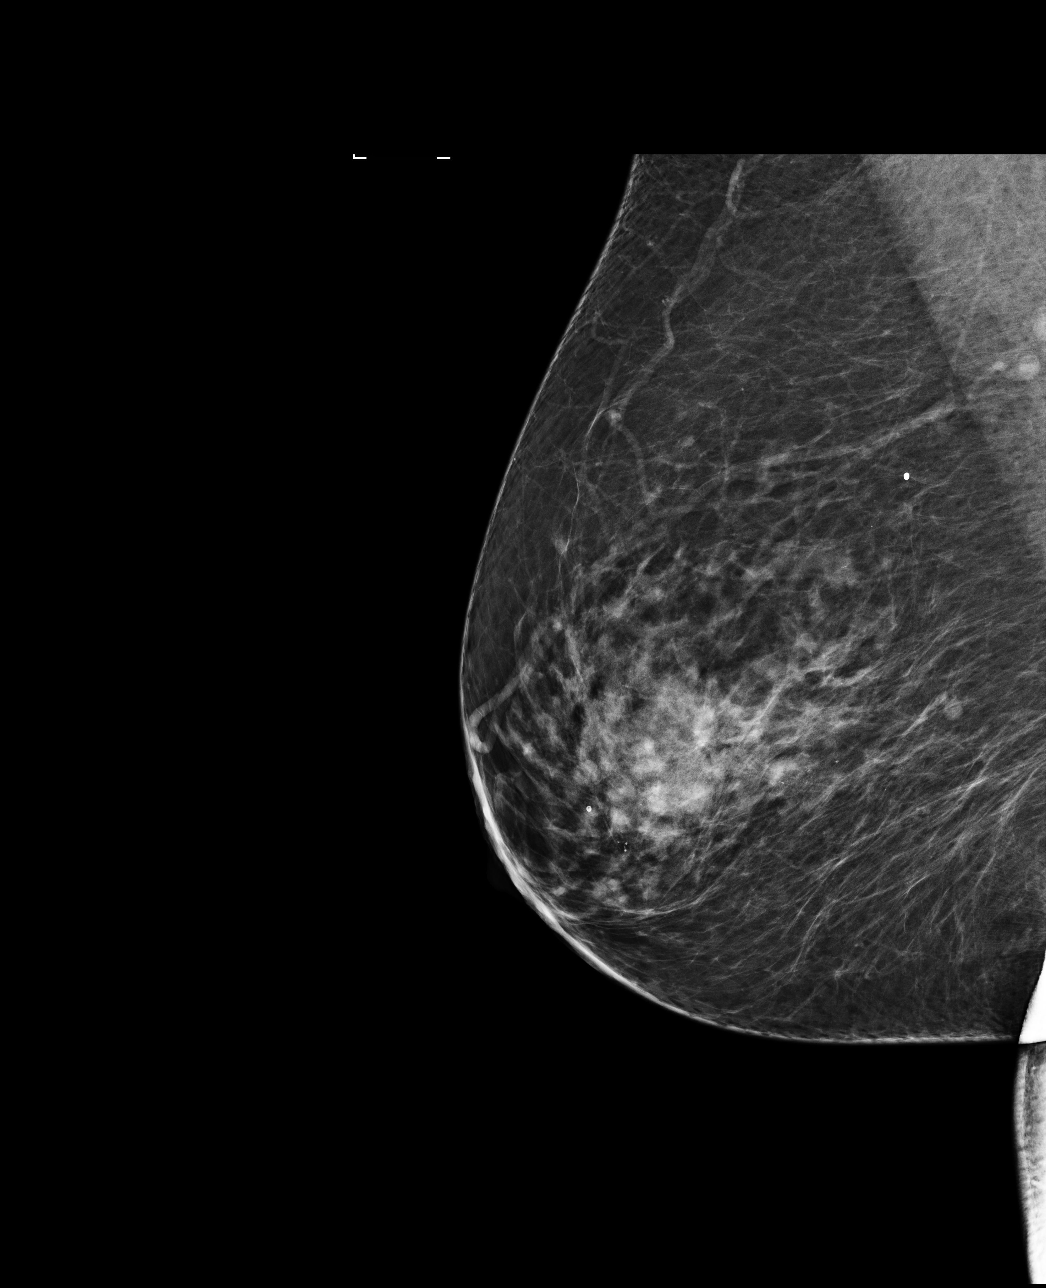

[R CC]
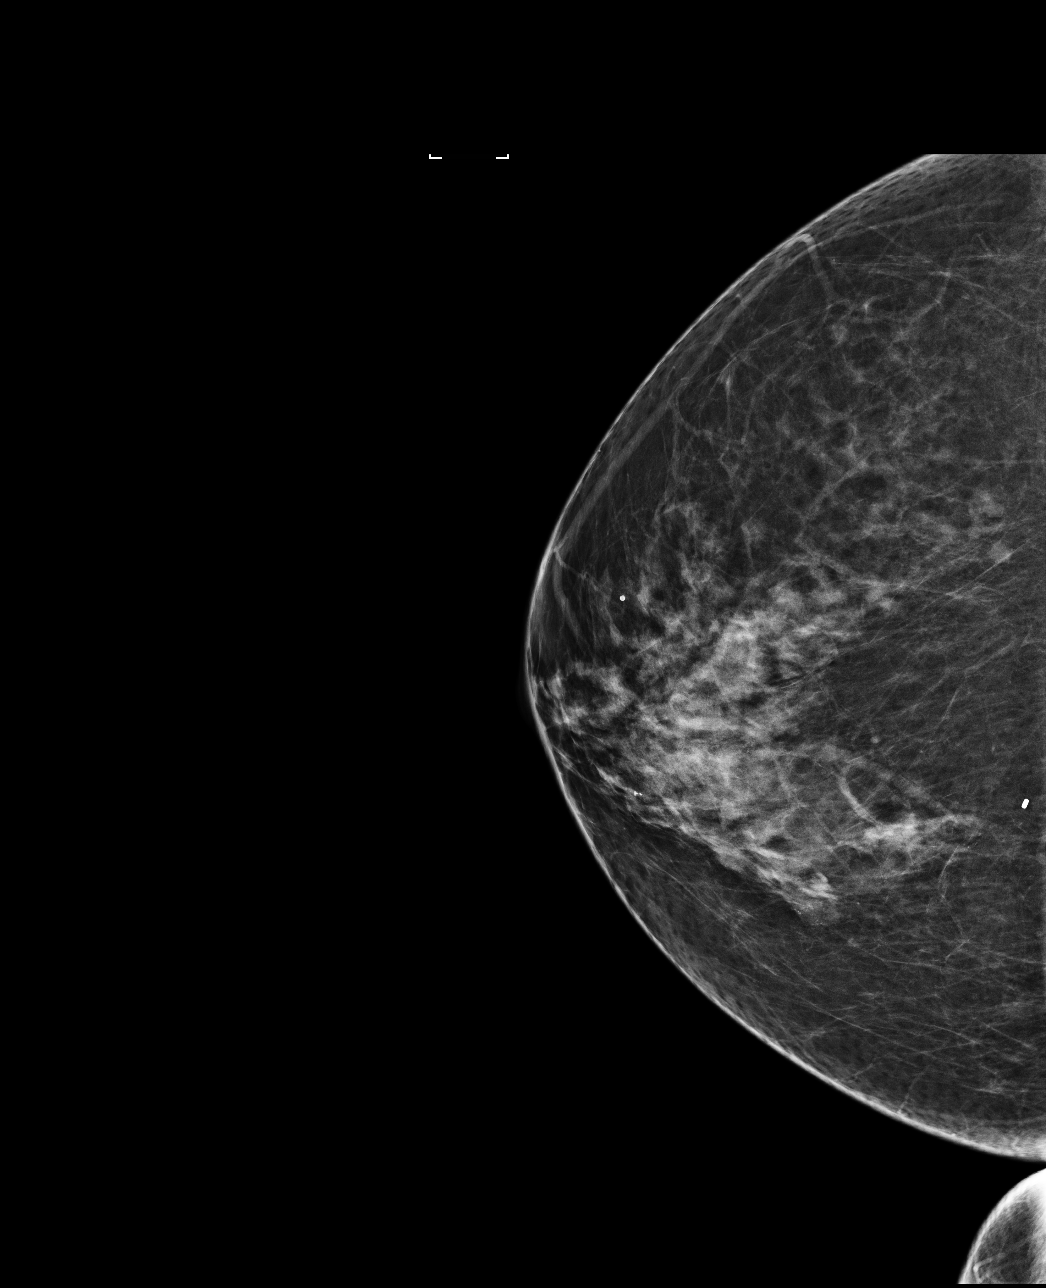

[L CC]
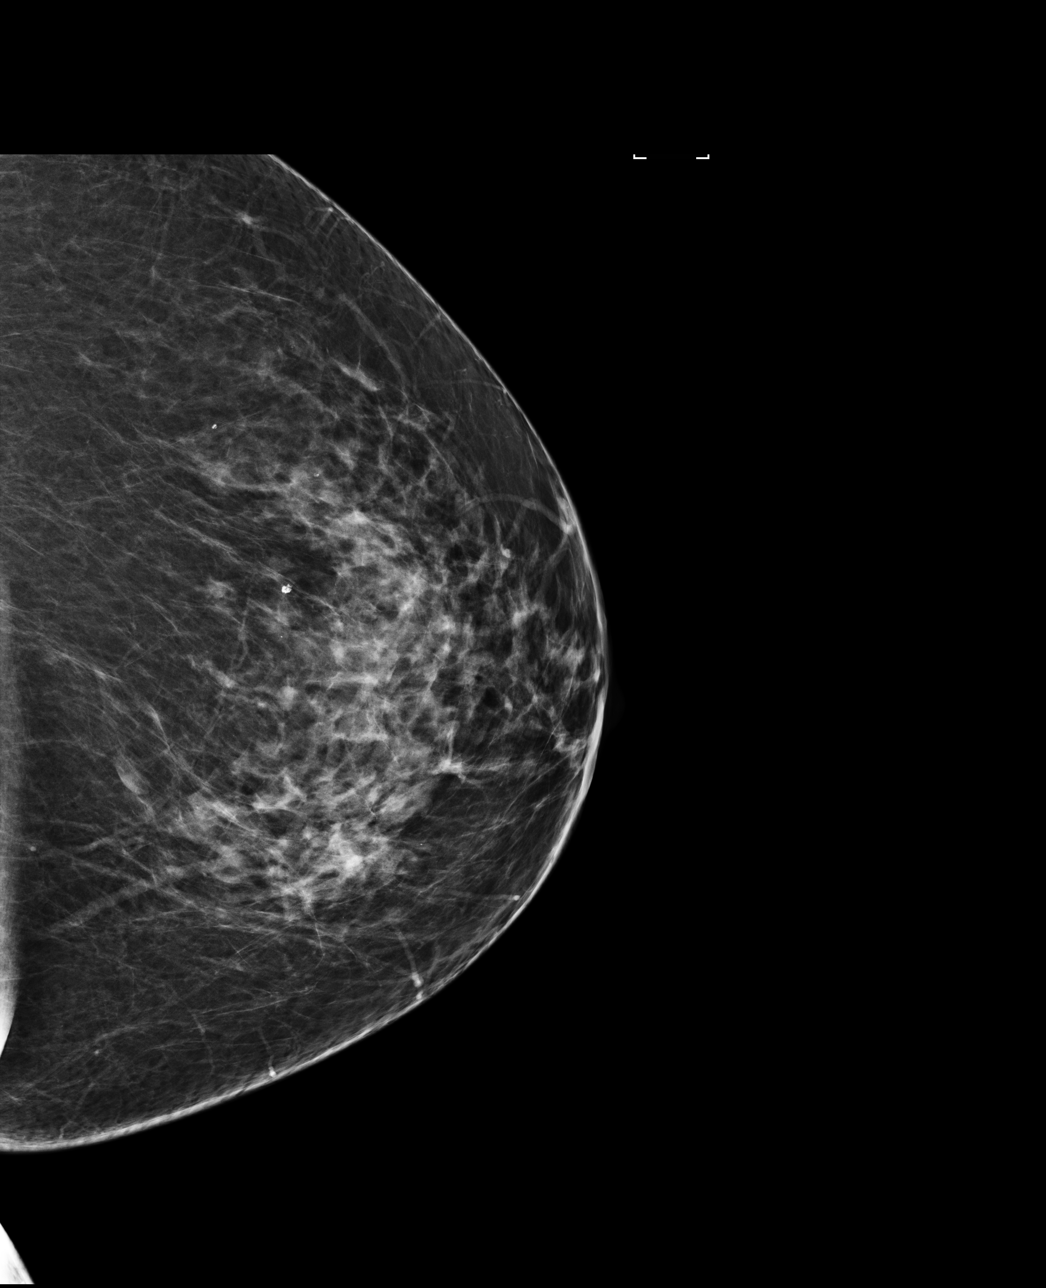

[4 of 4 positions shown; findings below may reference images not displayed]

ACR Breast Density Category c: The breast tissue is heterogeneously
dense, which may obscure small masses.
FINDINGS: In the left breast, a possible asymmetry warrants further
evaluation. In the right breast, no findings suspicious for
malignancy. Images were processed with CAD.
IMPRESSION: Further evaluation is suggested for possible asymmetry in the left
breast.

RECOMMENDATION:
Diagnostic mammogram and possibly ultrasound of the left breast.
(Code:[TL])

The patient will be contacted regarding the findings, and additional
imaging will be scheduled.

BI-RADS CATEGORY  0: Incomplete. Need additional imaging evaluation
and/or prior mammograms for comparison.

## 2019-12-14 ENCOUNTER — Other Ambulatory Visit: Payer: Self-pay | Admitting: Family Medicine

## 2019-12-14 DIAGNOSIS — R928 Other abnormal and inconclusive findings on diagnostic imaging of breast: Secondary | ICD-10-CM

## 2019-12-18 ENCOUNTER — Other Ambulatory Visit: Payer: Self-pay

## 2019-12-18 ENCOUNTER — Ambulatory Visit: Payer: Medicare Other

## 2019-12-18 ENCOUNTER — Ambulatory Visit
Admission: RE | Admit: 2019-12-18 | Discharge: 2019-12-18 | Disposition: A | Discharge status: Home / Self Care | Payer: Medicare Other | Source: Ambulatory Visit | Attending: Family Medicine | Admitting: Family Medicine

## 2019-12-18 DIAGNOSIS — R928 Other abnormal and inconclusive findings on diagnostic imaging of breast: Secondary | ICD-10-CM

## 2019-12-18 IMAGING — MG MM DIGITAL DIAGNOSTIC UNILAT*L* W/ TOMO W/ CAD
4 series · 4 of 12 positions shown · non-contrast
Comparison: Previous exam(s).

CLINICAL DATA: Patient was recalled from screening mammogram for a
possible asymmetry in the left breast.

EXAM:
DIGITAL DIAGNOSTIC UNILATERAL LEFT MAMMOGRAM WITH TOMO AND CAD

[L MLO synth-2D]
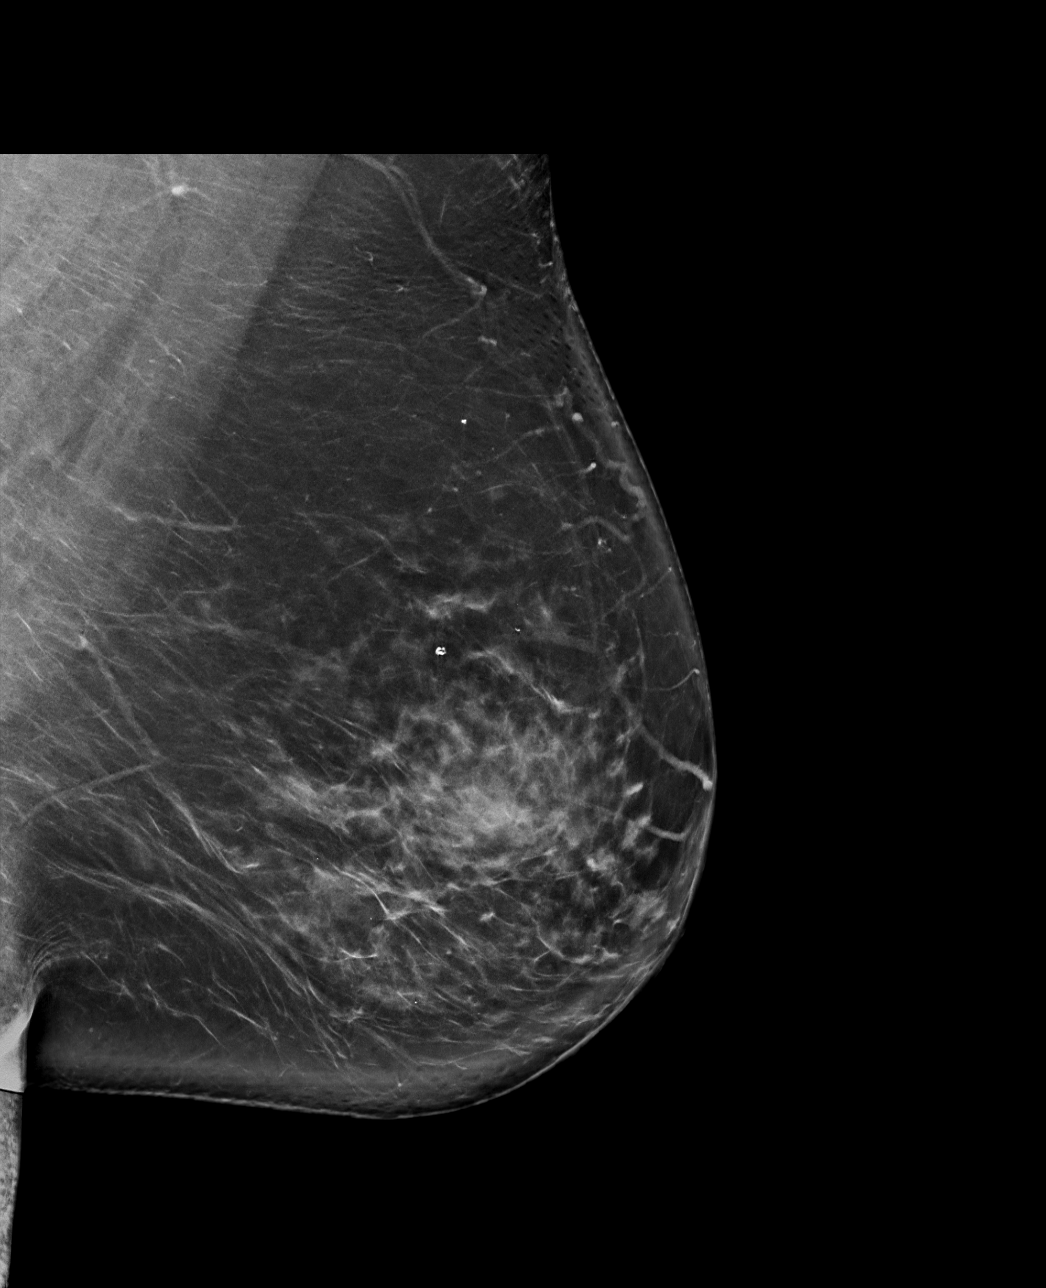

[L CC synth-2D]
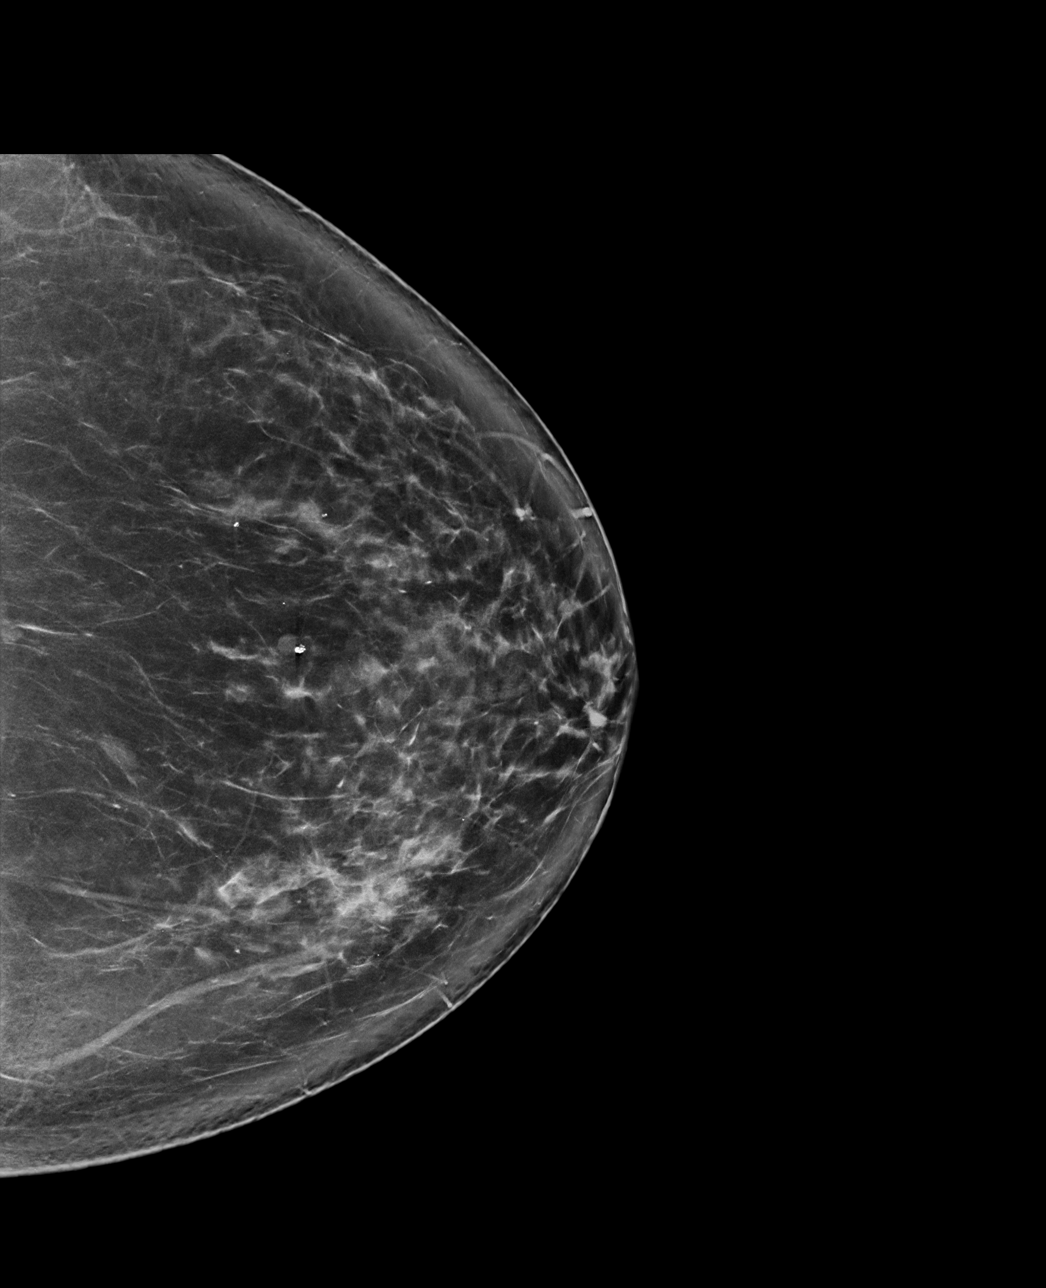

[L CC tomo · tomo slice 45/89.0]
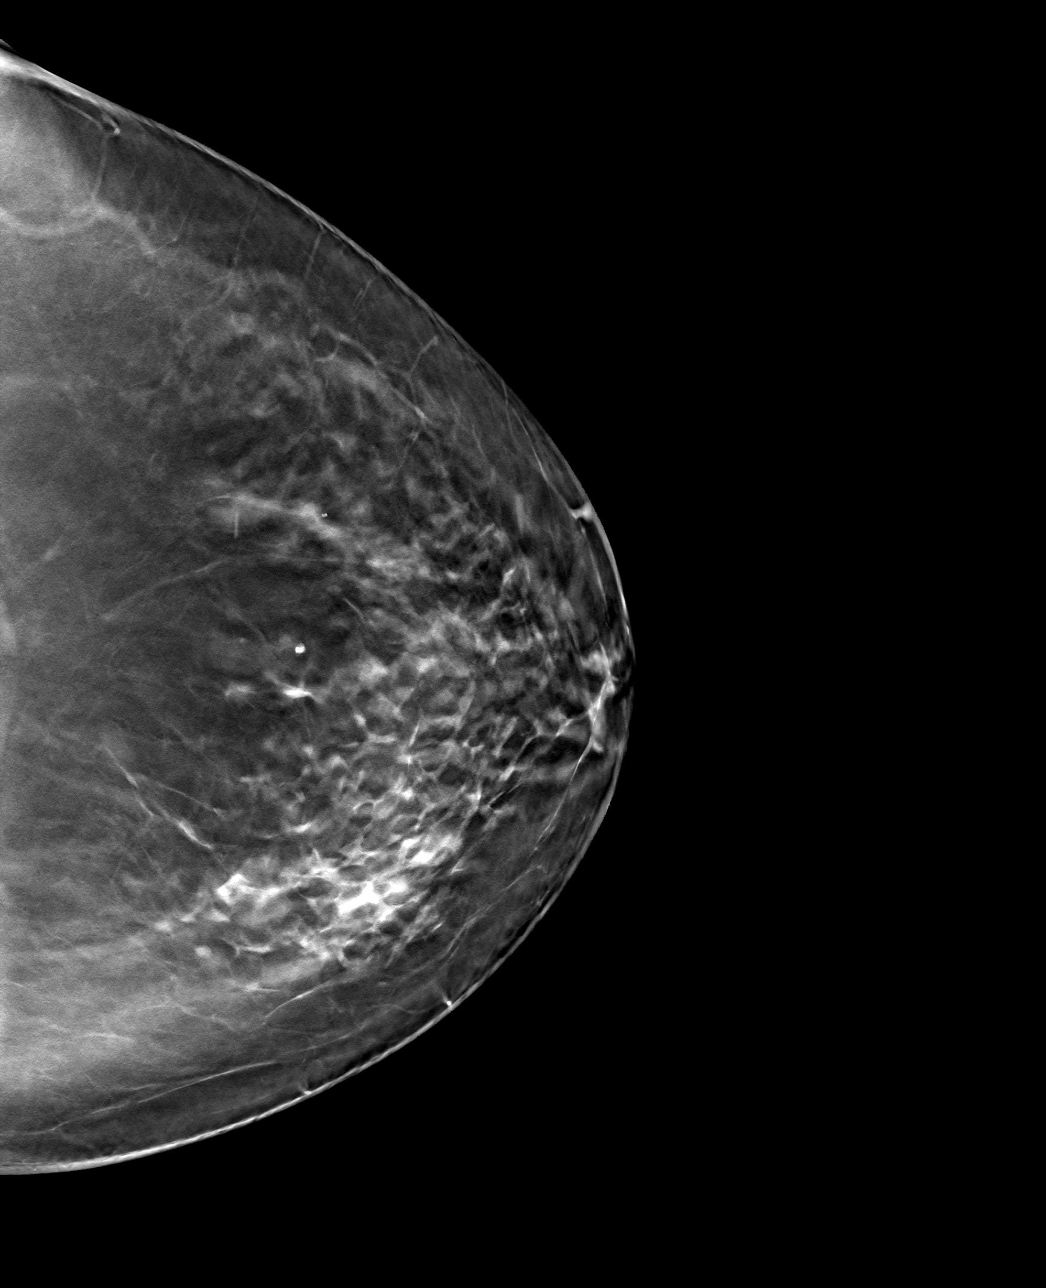

[L MLO tomo · tomo slice 51/100.0]
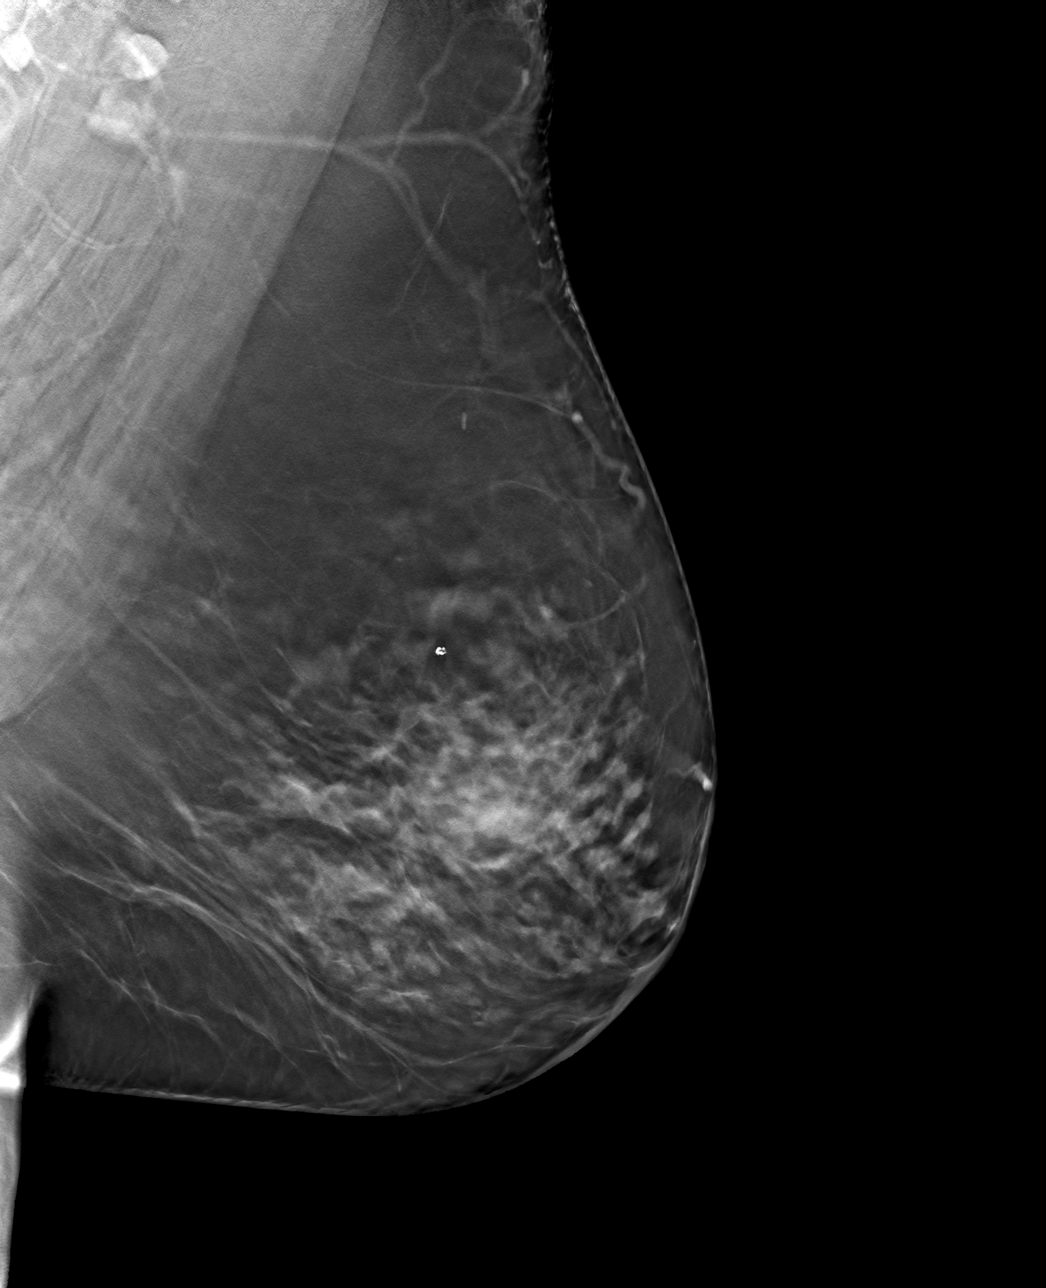

[4 of 12 positions shown; findings below may reference images not displayed]

ACR Breast Density Category c: The breast tissue is heterogeneously
dense, which may obscure small masses.
FINDINGS: Additional imaging of the left breast was performed. No persistent
mass, distortion or malignant type microcalcifications identified.

Mammographic images were processed with CAD.
IMPRESSION: No evidence of malignancy in the left breast.

RECOMMENDATION:
Bilateral screening mammogram in 1 year is recommended.

I have discussed the findings and recommendations with the patient.
If applicable, a reminder letter will be sent to the patient
regarding the next appointment.

BI-RADS CATEGORY  1: Negative.

## 2019-12-21 ENCOUNTER — Ambulatory Visit
Admission: RE | Admit: 2019-12-21 | Discharge: 2019-12-21 | Disposition: A | Discharge status: Home / Self Care | Payer: Medicare Other | Source: Ambulatory Visit | Attending: Family Medicine | Admitting: Family Medicine

## 2019-12-21 ENCOUNTER — Other Ambulatory Visit: Payer: Self-pay

## 2019-12-21 DIAGNOSIS — H93A2 Pulsatile tinnitus, left ear: Secondary | ICD-10-CM | POA: Diagnosis not present

## 2019-12-21 IMAGING — MR MR HEAD WO/W CM
10 of 13 series · 26 of 48 positions shown · IV contrast (gadavist)
Comparison: No pertinent prior exams are available for comparison.

CLINICAL DATA: Pulsatile tinnitus, left ear. Additional history
provided by scanning technologist: Patient reports bilateral
pulsatile tinnitus (left greater than right, left-sided face and ear
pain, left-sided hearing loss.

EXAM:
MRI HEAD WITHOUT AND WITH CONTRAST
TECHNIQUE: Multiplanar, multiecho pulse sequences of the brain and surrounding
structures were obtained without and with intravenous contrast.
CONTRAST:  9mL GADAVIST GADOBUTROL 1 MMOL/ML IV SOLN

[Series 5: T1 · sagittal · 5.0mm · 0.62mm/px · 1 of 23 slices shown (1 of 3)]
[im 1/23]
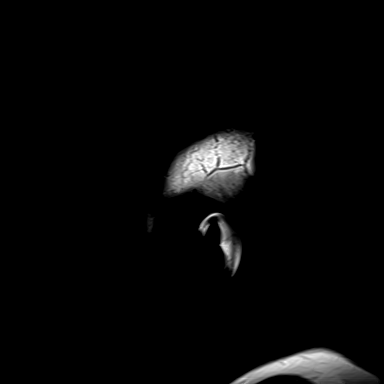

[Series 6: ax dwi_tracew · axial · 3.0mm · 0.60mm/px · z∈[-121,+31]mm · 4 of 48 slices shown]
[im 1/48]
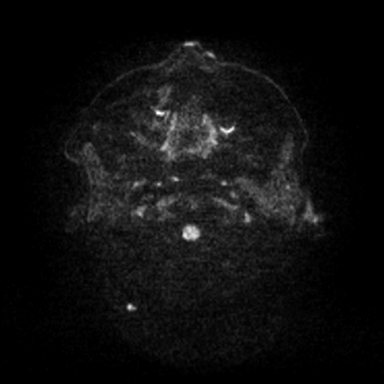
[im 16/48]
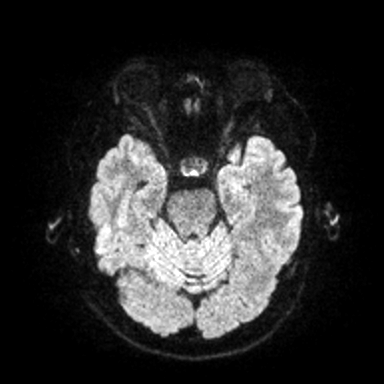
[im 32/48]
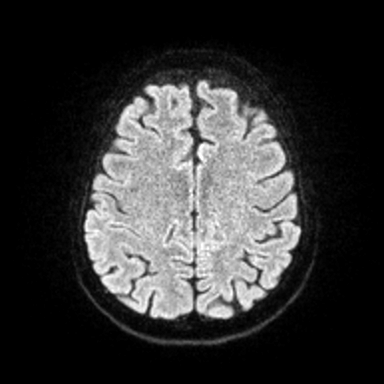
[im 48/48]
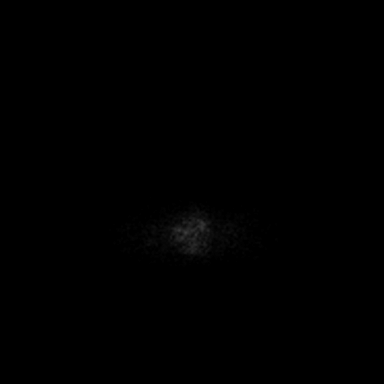

[Series 7: ax dwi_adc · axial · 3.0mm · 0.60mm/px · z∈[-121,-20]mm · 3 of 48 slices shown]
[im 1/48]
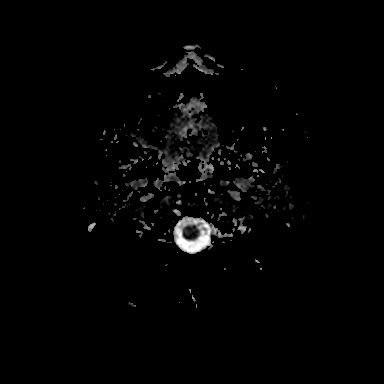
[im 16/48]
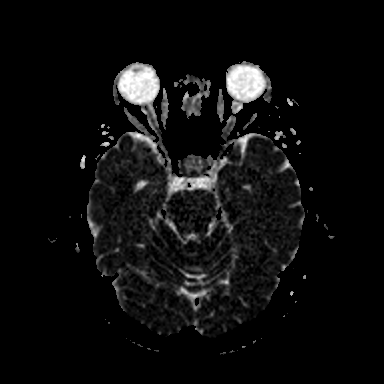
[im 32/48]
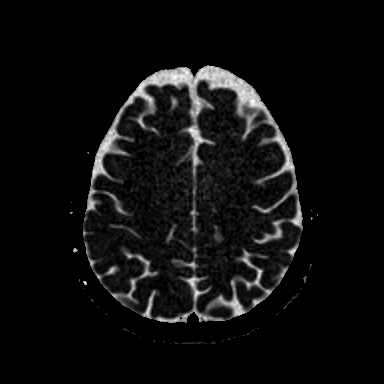

[Series 8: T2 · axial · 5.0mm · 0.53mm/px · z∈[-116,+25]mm · 2 of 25 slices shown]
[im 1/25]
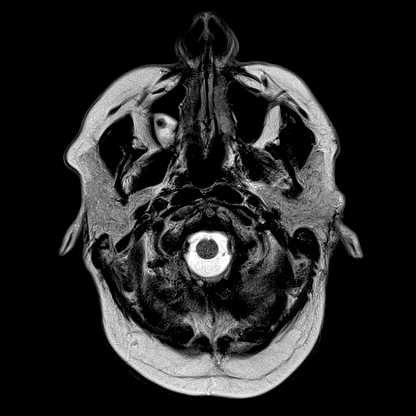
[im 25/25]
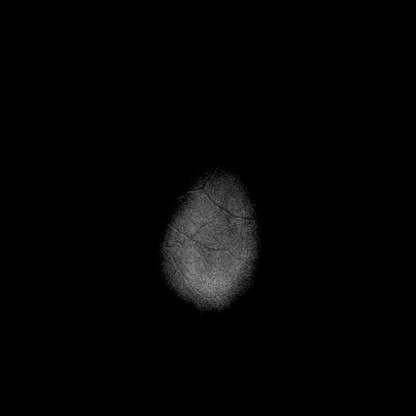

[Series 13: FLAIR · axial · 3.0mm · 0.53mm/px · z∈[-125,+34]mm · 4 of 55 slices shown]
[im 1/55]
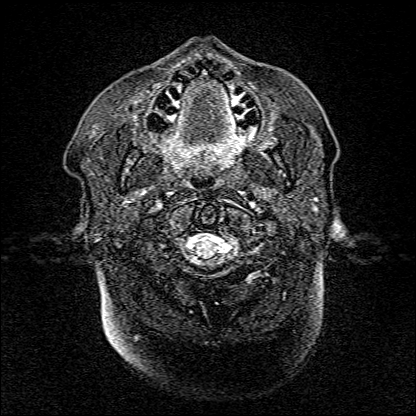
[im 19/55]
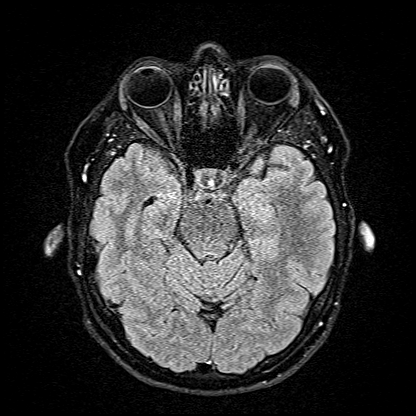
[im 37/55]
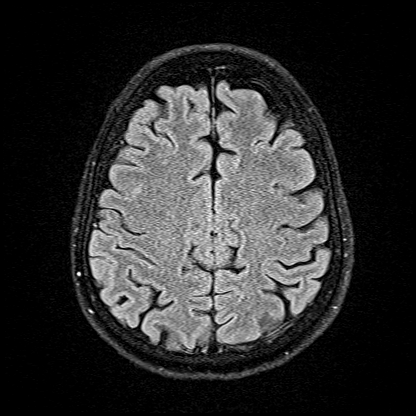
[im 55/55]
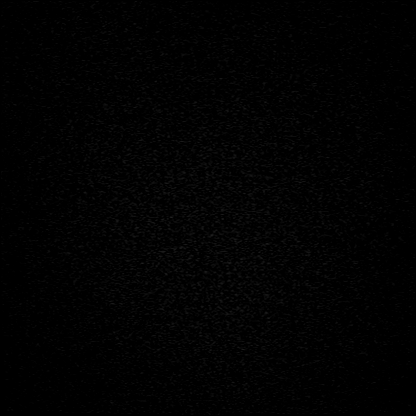

[Series 14: T1 · coronal · non-contrast · 3.0mm · 0.21mm/px · 1 of 13 slices shown (2 of 3)]
[im 1/13]
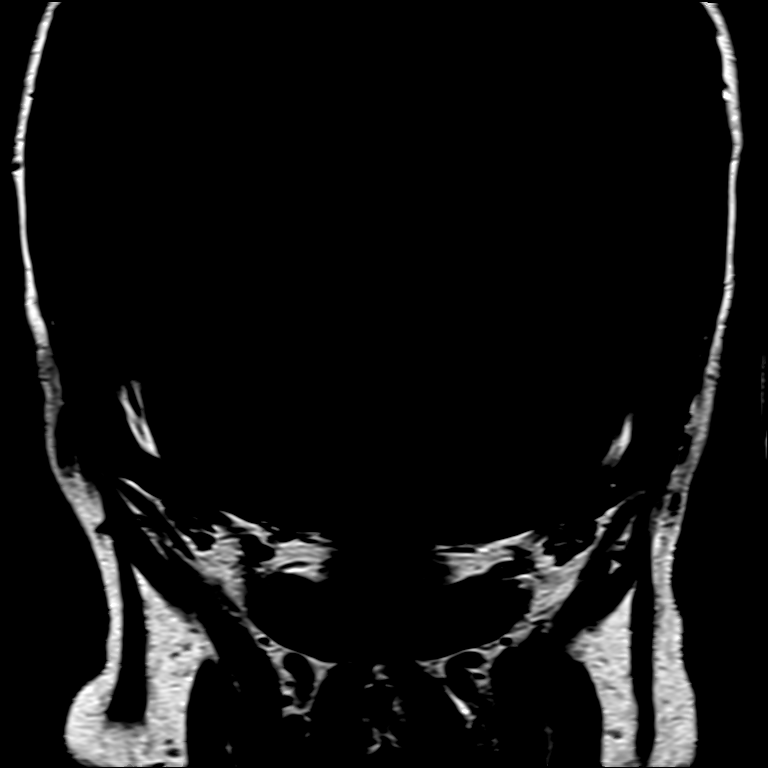

[Series 16: T1 · axial · non-contrast · 3.0mm · 0.21mm/px · 1 of 15 slices shown (3 of 3)]
[im 1/15]
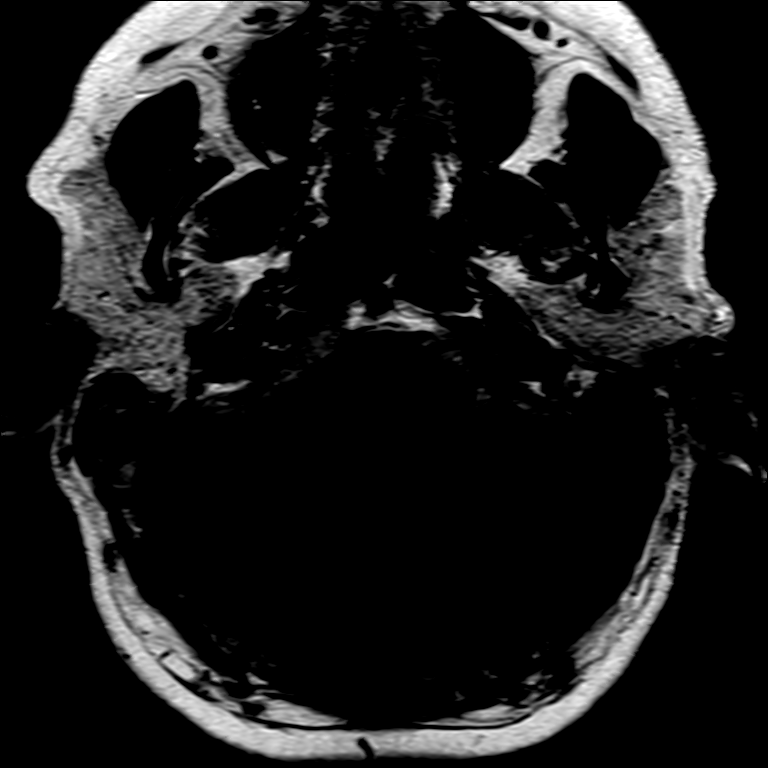

[Series 17: T1 post-contrast · axial · 3.0mm · 0.21mm/px · 1 of 15 slices shown (1 of 3)]
[im 1/15]
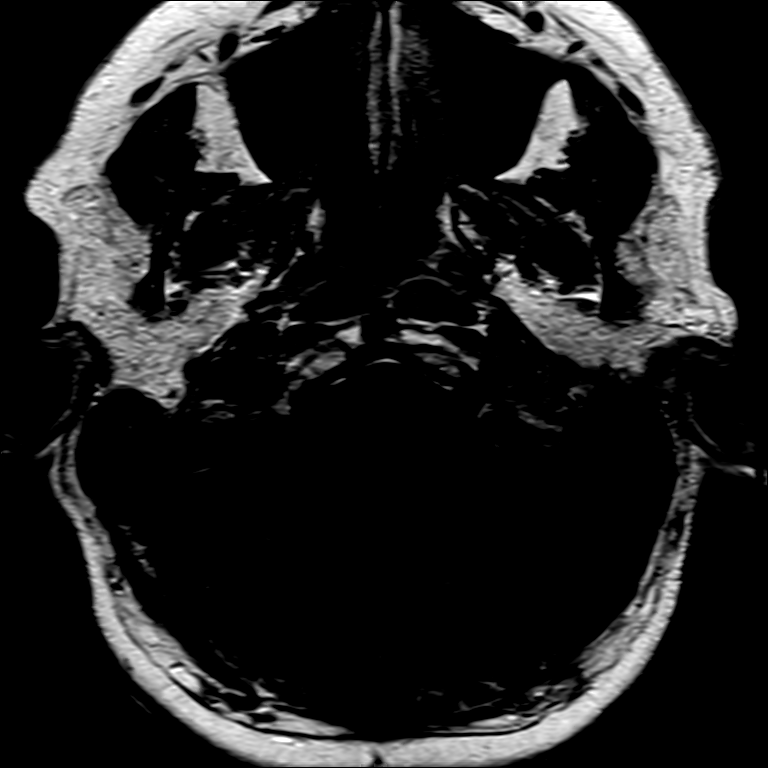

[Series 18: T1 post-contrast · coronal · 3.0mm · 0.21mm/px · 1 of 13 slices shown (2 of 3)]
[im 1/13]
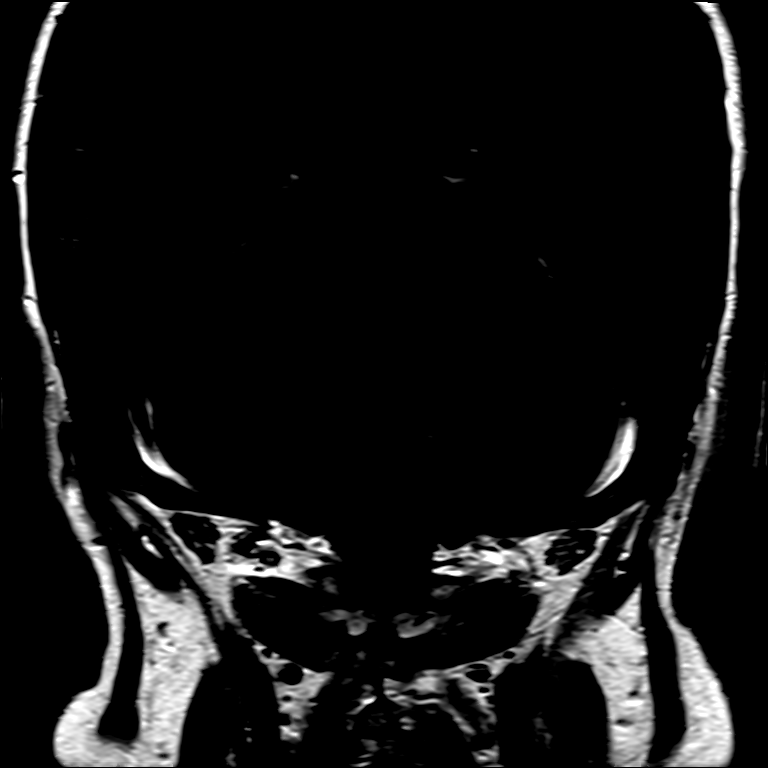

[Series 19: T1 post-contrast · axial · 1.0mm · 0.98mm/px · z∈[-129,+43]mm · 8 of 176 slices shown (3 of 3)]
[im 1/176]
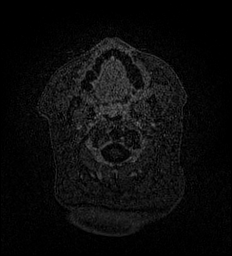
[im 27/176]
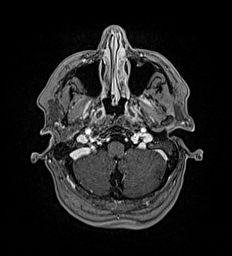
[im 54/176]
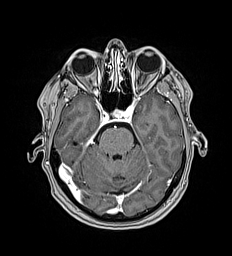
[im 81/176]
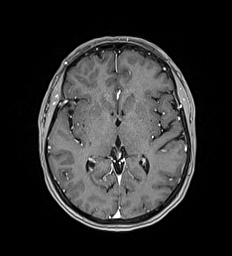
[im 95/176]
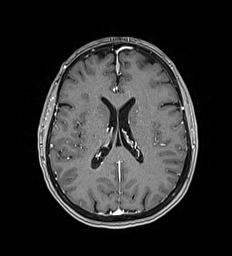
[im 122/176]
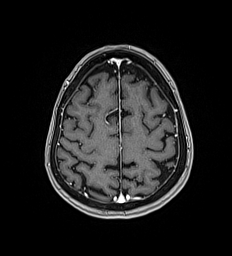
[im 149/176]
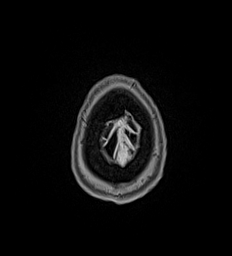
[im 176/176]
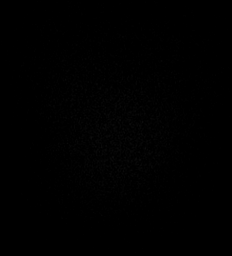

[26 of 48 positions shown; findings below may reference images not displayed]

FINDINGS: Brain:

Cerebral volume is normal.

There is a small focus of SWI signal loss within the right basal
ganglia which may reflect a chronic microhemorrhage or
mineralization.

There is no evidence of intracranial mass. Specifically, no
cerebellopontine angle mass or internal auditory canal lesion is
demonstrated.

There is no acute infarct.

No extra-axial fluid collection.

No midline shift.

No abnormal intracranial enhancement.

Vascular: Expected proximal arterial flow voids.

Skull and upper cervical spine: No focal marrow lesion is
identified.

Sinuses/Orbits: Visualized orbits show no acute finding. Mild
ethmoid sinus mucosal thickening moderate-sized right maxillary
sinus mucous retention cyst. Large left middle ear/mastoid effusion.
IMPRESSION: Large left middle ear/mastoid effusion.

No evidence of a cerebellopontine angle mass or internal auditory
canal lesion.

Subcentimeter focus of susceptibility-weighted signal loss within
the right basal ganglia, which may reflect a chronic microhemorrhage
or mineralization.

Otherwise unremarkable MRI appearance of the brain.

Mild ethmoid sinus mucosal thickening. Moderate-sized right
maxillary sinus mucous retention cyst.

## 2019-12-21 MED ORDER — GADOBUTROL 1 MMOL/ML IV SOLN
9.0000 mL | Freq: Once | INTRAVENOUS | Status: AC | PRN
  Administered 2019-12-21: 9 mL via INTRAVENOUS

## 2020-07-10 ENCOUNTER — Other Ambulatory Visit: Payer: Self-pay | Admitting: Family Medicine

## 2020-07-10 DIAGNOSIS — M5416 Radiculopathy, lumbar region: Secondary | ICD-10-CM

## 2020-07-25 ENCOUNTER — Other Ambulatory Visit: Payer: Self-pay

## 2020-07-25 ENCOUNTER — Ambulatory Visit
Admission: RE | Admit: 2020-07-25 | Discharge: 2020-07-25 | Disposition: A | Discharge status: Home / Self Care | Payer: Medicare Other | Source: Ambulatory Visit | Attending: Family Medicine | Admitting: Family Medicine

## 2020-07-25 DIAGNOSIS — M5416 Radiculopathy, lumbar region: Secondary | ICD-10-CM

## 2020-07-25 IMAGING — MR MR LUMBAR SPINE W/O CM
4 of 5 series · 27 of 48 positions shown · non-contrast
Comparison: None.

CLINICAL DATA: Intermittent deep pain into the right calf.

EXAM:
MRI LUMBAR SPINE WITHOUT CONTRAST
TECHNIQUE: Multiplanar, multisequence MR imaging of the lumbar spine was
performed. No intravenous contrast was administered.

[Series 3: T2 · sagittal · 4.0mm · 1.09mm/px · 7 of 17 slices shown (1 of 2)]
[im 1/17]
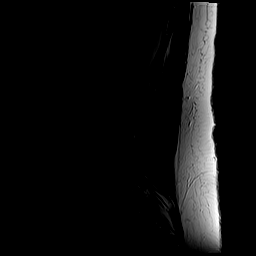
[im 3/17]
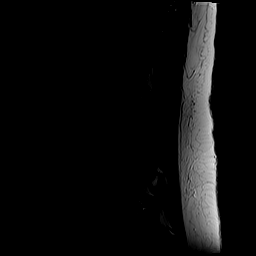
[im 6/17]
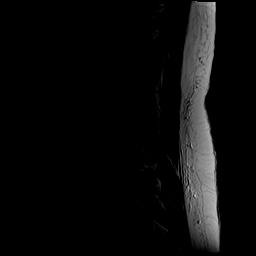
[im 9/17]
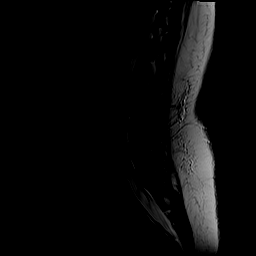
[im 11/17]
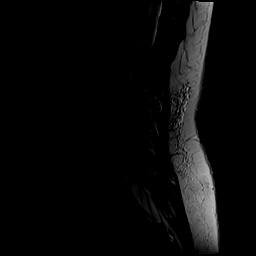
[im 14/17]
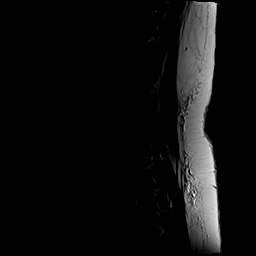
[im 17/17]
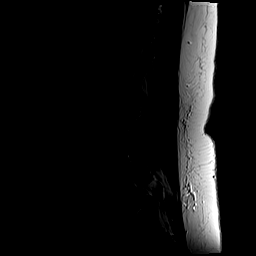

[Series 5: T1 · sagittal · 4.0mm · 1.09mm/px · 6 of 17 slices shown (1 of 2)]
[im 1/17]
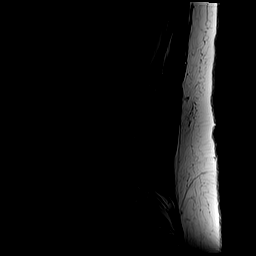
[im 4/17]
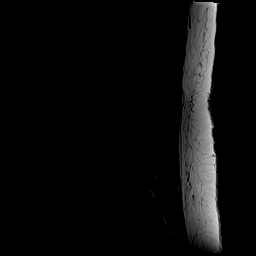
[im 7/17]
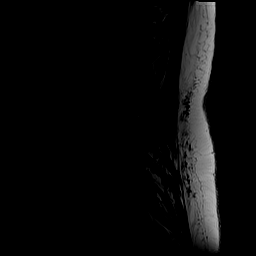
[im 10/17]
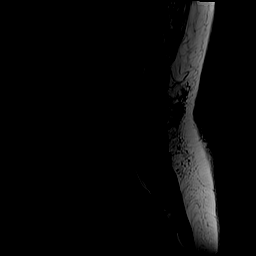
[im 13/17]
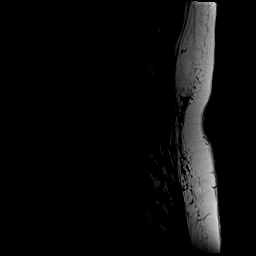
[im 17/17]
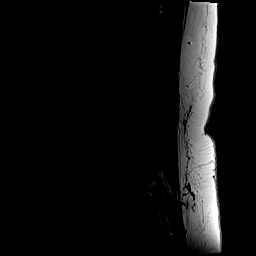

[Series 6: T2 · axial · 4.0mm · 0.39mm/px · z∈[-40,+162]mm · 8 of 38 slices shown (2 of 2)]
[im 1/38]
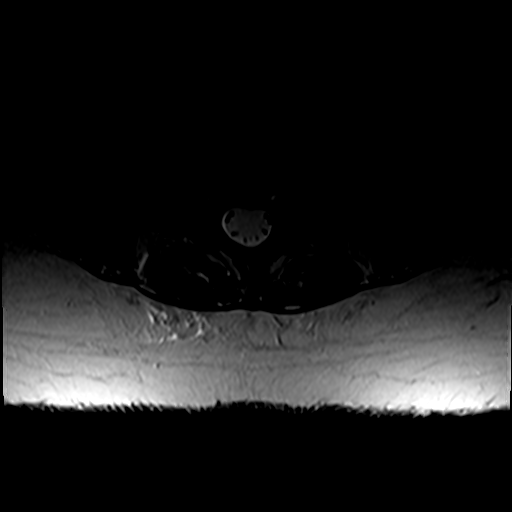
[im 6/38]
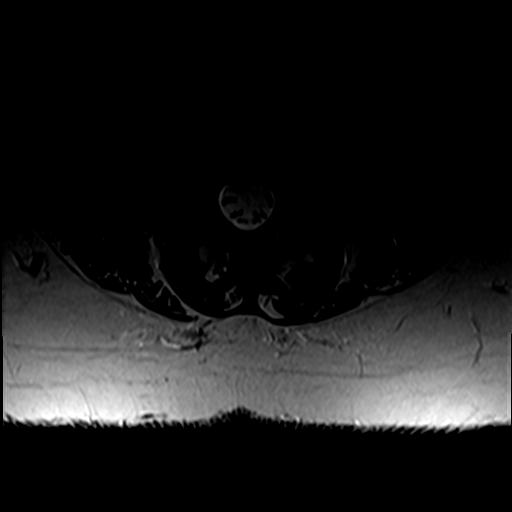
[im 12/38]
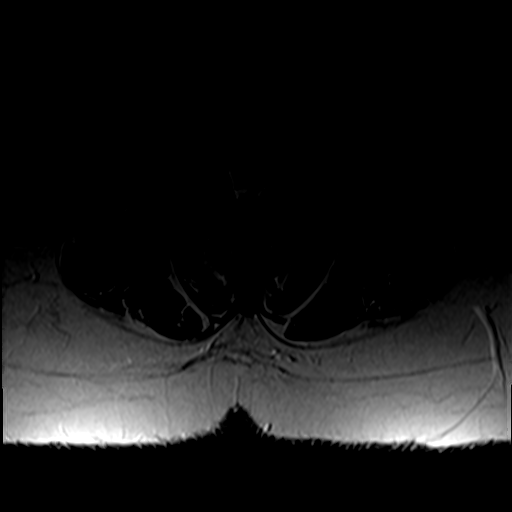
[im 18/38]
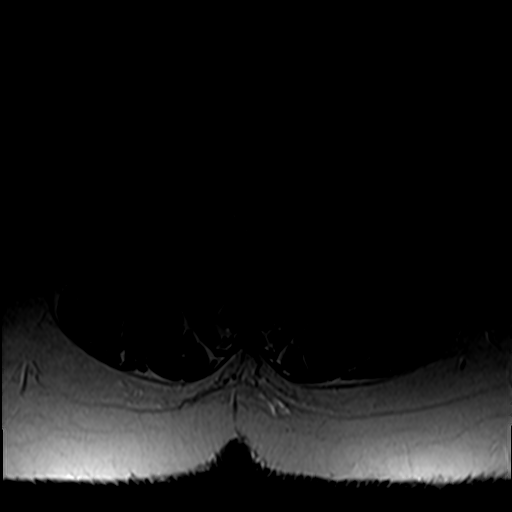
[im 20/38]
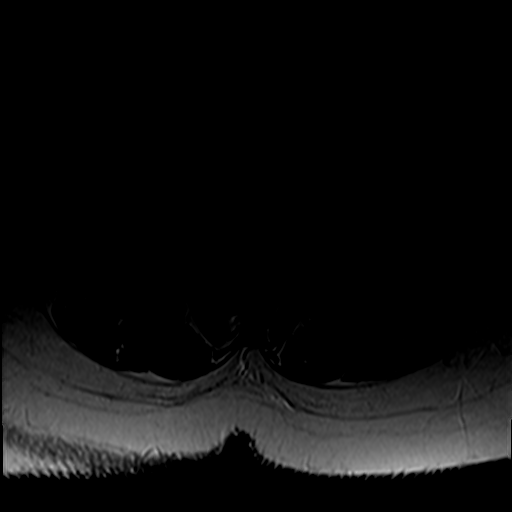
[im 26/38]
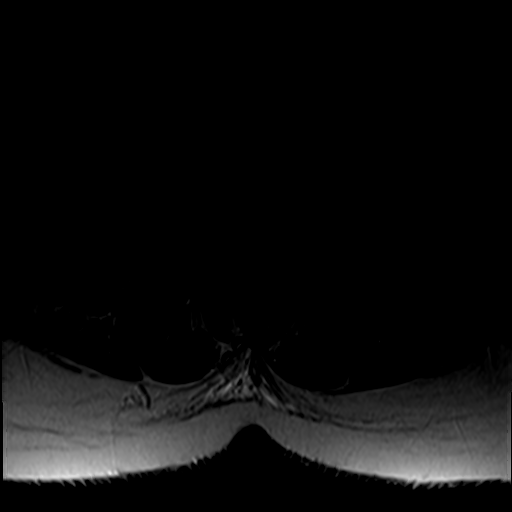
[im 32/38]
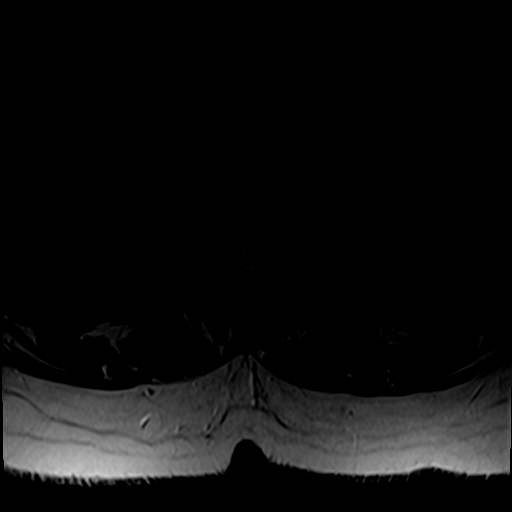
[im 38/38]
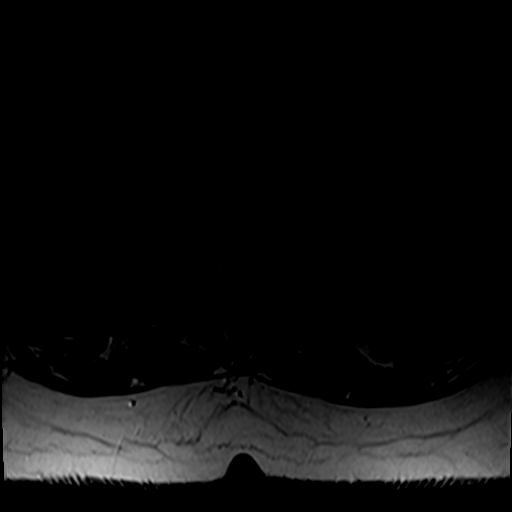

[Series 7: T1 · axial · 4.0mm · 0.39mm/px · z∈[-40,+133]mm · 6 of 38 slices shown (2 of 2)]
[im 1/38]
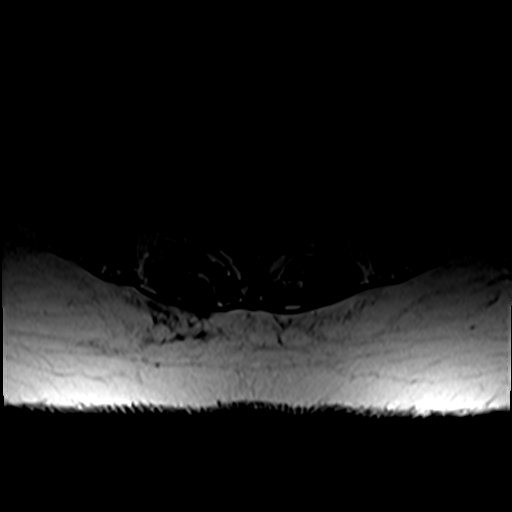
[im 6/38]
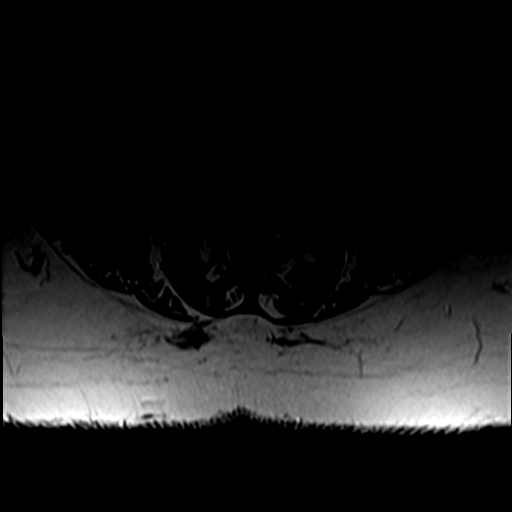
[im 12/38]
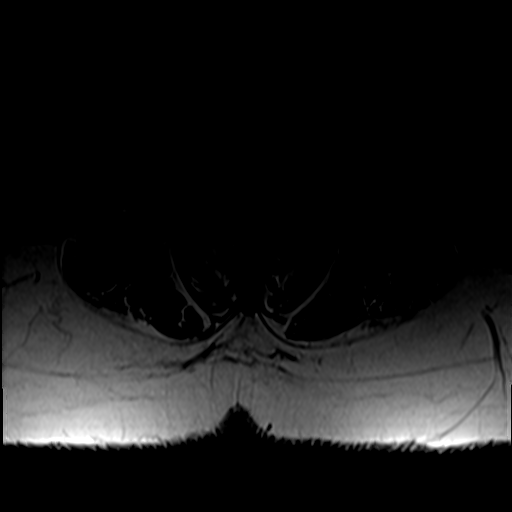
[im 18/38]
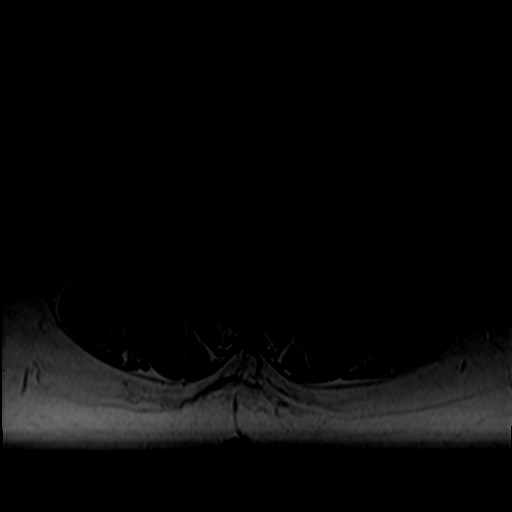
[im 20/38]
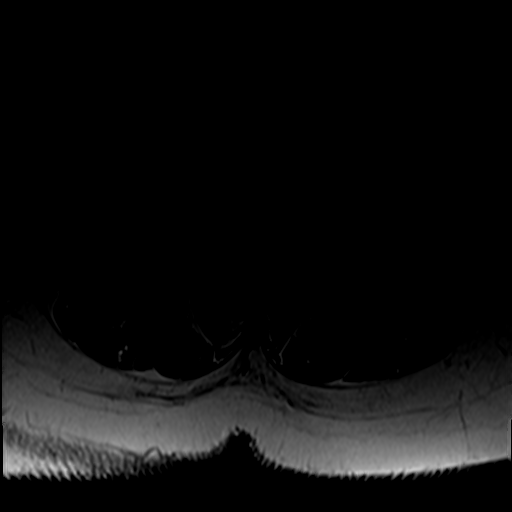
[im 32/38]
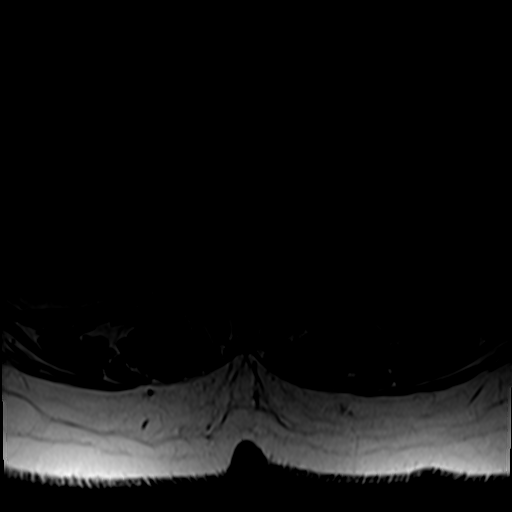

[27 of 48 positions shown; findings below may reference images not displayed]

FINDINGS: Segmentation: 5 non rib-bearing lumbar type vertebral bodies are
present. The lowest fully formed vertebral body is L5.

Alignment: Slight degenerative anterolisthesis is present at L3-4.
Grade 1 anterolisthesis at L4-5 measures 4 mm. No other significant
listhesis is present. Mild levoconvex curvature is centered at L3.

Vertebrae: Mild fatty endplate marrow changes are present at L2-3
with chronic Schmorl's nodes, worse on the right. Minimal fatty
endplate marrow changes are present on the right at L3-4 as well.
Marrow signal and vertebral body heights are otherwise normal.

Conus medullaris and cauda equina: Conus extends to the L1 level.
Conus and cauda equina appear normal.

Paraspinal and other soft tissues: Limited imaging the abdomen is
unremarkable. There is no significant adenopathy. No solid organ
lesions are present.

Disc levels:

Shallow central disc protrusions are present at T10-11 and T11-12
without significant stenosis.

T12-L1: Negative.

L1-2: Moderate facet hypertrophy is present bilaterally. Mild disc
bulging is present. No significant stenosis is present.

L2-3: A broad-based disc protrusion is present. Moderate facet
hypertrophy is worse on the right. There is mild crowding of the
central canal. Mild foraminal narrowing is worse on the right.

L3-4: A broad-based disc protrusion is present. Moderate facet
hypertrophy is worse on the right. Mild bilateral foraminal
narrowing is present.

L4-5: A broad-based disc protrusion is present. Asymmetric
left-sided facet hypertrophy is noted. No significant stenosis is
present.

L5-S1: Mild facet hypertrophy is noted bilaterally. Shallow central
disc protrusion noted without significant stenosis.
IMPRESSION: 1. The most significant right-sided disease is at L2-3 and L3-4. No
compression evident on the right at L4-5 or L5-S1.
2. Mild foraminal narrowing bilaterally at L2-3 is worse on the
right.
3. Mild bilateral foraminal narrowing at L3-4.
4. Moderate facet hypertrophy at L1-2 without significant stenosis.
5. Shallow central disc protrusion at L5-S1 without significant
stenosis.

## 2020-08-20 DIAGNOSIS — Z860100 Personal history of colon polyps, unspecified: Secondary | ICD-10-CM | POA: Insufficient documentation

## 2020-08-20 DIAGNOSIS — K5100 Ulcerative (chronic) pancolitis without complications: Secondary | ICD-10-CM | POA: Insufficient documentation

## 2021-04-23 DIAGNOSIS — M2392 Unspecified internal derangement of left knee: Secondary | ICD-10-CM | POA: Insufficient documentation

## 2021-09-01 ENCOUNTER — Encounter: Payer: Self-pay | Admitting: Emergency Medicine

## 2021-09-01 ENCOUNTER — Emergency Department
Admission: EM | Admit: 2021-09-01 | Discharge: 2021-09-01 | Disposition: A | Discharge status: Home / Self Care | Payer: Medicare Other | Attending: Emergency Medicine | Admitting: Emergency Medicine

## 2021-09-01 ENCOUNTER — Other Ambulatory Visit: Payer: Self-pay

## 2021-09-01 DIAGNOSIS — M79605 Pain in left leg: Secondary | ICD-10-CM | POA: Diagnosis present

## 2021-09-01 DIAGNOSIS — M25562 Pain in left knee: Secondary | ICD-10-CM | POA: Insufficient documentation

## 2021-09-01 MED ORDER — CYCLOBENZAPRINE HCL 10 MG PO TABS: 10.0000 mg | ORAL_TABLET | Freq: Three times a day (TID) | ORAL | 0 refills | Status: DC | PRN

## 2021-09-01 MED ORDER — KETOROLAC TROMETHAMINE 10 MG PO TABS: 10.0000 mg | ORAL_TABLET | Freq: Four times a day (QID) | ORAL | 0 refills | Status: DC | PRN

## 2021-09-01 MED ORDER — KETOROLAC TROMETHAMINE 60 MG/2ML IM SOLN
60.0000 mg | Freq: Once | INTRAMUSCULAR | Status: AC
  Administered 2021-09-01: 60 mg via INTRAMUSCULAR
  Filled 2021-09-01: qty 2

## 2021-09-01 MED ORDER — CYCLOBENZAPRINE HCL 10 MG PO TABS
10.0000 mg | ORAL_TABLET | Freq: Once | ORAL | Status: AC
  Administered 2021-09-01: 10 mg via ORAL
  Filled 2021-09-01: qty 1

## 2021-09-01 NOTE — ED Triage Notes (Signed)
Pt to ED via POV, pt states that around 0200 she moved her foot and felt something pop in her leg right below her knee. Pt states that she has been having pain since then. Pt is currently in NAD.

## 2022-01-05 ENCOUNTER — Inpatient Hospital Stay: Payer: Medicare Other

## 2022-01-05 ENCOUNTER — Encounter: Payer: Self-pay | Admitting: Oncology

## 2022-01-05 ENCOUNTER — Inpatient Hospital Stay: Payer: Medicare Other | Attending: Oncology | Admitting: Oncology

## 2022-01-05 VITALS — BP 126/107 | HR 71 | Temp 97.6°F | Resp 18 | Wt 204.2 lb

## 2022-01-05 DIAGNOSIS — D509 Iron deficiency anemia, unspecified: Secondary | ICD-10-CM | POA: Insufficient documentation

## 2022-01-05 DIAGNOSIS — Z79890 Hormone replacement therapy (postmenopausal): Secondary | ICD-10-CM | POA: Insufficient documentation

## 2022-01-05 DIAGNOSIS — I10 Essential (primary) hypertension: Secondary | ICD-10-CM

## 2022-01-05 DIAGNOSIS — J309 Allergic rhinitis, unspecified: Secondary | ICD-10-CM | POA: Insufficient documentation

## 2022-01-05 DIAGNOSIS — Z79899 Other long term (current) drug therapy: Secondary | ICD-10-CM | POA: Diagnosis not present

## 2022-01-05 DIAGNOSIS — R5383 Other fatigue: Secondary | ICD-10-CM | POA: Diagnosis not present

## 2022-01-05 DIAGNOSIS — Z791 Long term (current) use of non-steroidal anti-inflammatories (NSAID): Secondary | ICD-10-CM | POA: Diagnosis not present

## 2022-01-05 DIAGNOSIS — E039 Hypothyroidism, unspecified: Secondary | ICD-10-CM | POA: Diagnosis not present

## 2022-01-05 DIAGNOSIS — E785 Hyperlipidemia, unspecified: Secondary | ICD-10-CM | POA: Diagnosis not present

## 2022-01-05 DIAGNOSIS — E7800 Pure hypercholesterolemia, unspecified: Secondary | ICD-10-CM

## 2022-01-05 HISTORY — DX: Essential (primary) hypertension: I10

## 2022-01-05 HISTORY — DX: Pure hypercholesterolemia, unspecified: E78.00

## 2022-01-05 NOTE — Progress Notes (Signed)
Hematology/Oncology Consult note [facility] Telephone:([phone] Fax:([phone]  Patient Care Team: Leonides Sake, MD as PCP - General (Family Medicine)   Name of the patient: Rebecca Strong  [id number]  [birth year]    Reason for referral-iron deficiency anemia   Referring physician-Dr. Daiva Eves  Date of visit: 01/05/22   History of presenting illness- Patient is a 66 year old female with a past medical history significant for hypothyroidism hypertension hyperlipidemia among other medical problems.  She has been referred for iron deficiency anemia.  She has been a frequent blood donor about every 4 months.  Most recent CBC from October 2023 showed an H&H of 12.4/40.8 with an MCV of 77.  White count and platelets were normal.  Ferritin level was low at 8 with an iron saturation of 9%.  TIBC was elevated at 469.  CMP within normal limits.  Patient had symptoms of nonspecific colitis in her 60s and underwent multiple colonoscopies at that time.  Her last colonoscopy was about 5 years ago which was unremarkable.  She is currently postmenopausal.  Denies any blood loss in her stool or urine.  Denies any dark melanotic stools.  Denies any consistent use of NSAIDs.  She does report having significant hair loss in the last 3 weeks.  Has some ongoing fatigue  ECOG PS- 0  Pain scale- 0   Review of systems- Review of Systems  Constitutional:  Positive for malaise/fatigue. Negative for chills, fever and weight loss.       Hair loss  HENT:  Negative for congestion, ear discharge and nosebleeds.   Eyes:  Negative for blurred vision.  Respiratory:  Negative for cough, hemoptysis, sputum production, shortness of breath and wheezing.   Cardiovascular:  Negative for chest pain, palpitations, orthopnea and claudication.  Gastrointestinal:  Negative for abdominal pain, blood in stool, constipation, diarrhea, heartburn, melena, nausea and vomiting.   Genitourinary:  Negative for dysuria, flank pain, frequency, hematuria and urgency.  Musculoskeletal:  Negative for back pain, joint pain and myalgias.  Skin:  Negative for rash.  Neurological:  Negative for dizziness, tingling, focal weakness, seizures, weakness and headaches.  Endo/Heme/Allergies:  Does not bruise/bleed easily.  Psychiatric/Behavioral:  Negative for depression and suicidal ideas. The patient does not have insomnia.     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     Past Medical History:  Diagnosis Date   Plantar fasciitis      Past Surgical History:  Procedure Laterality Date   SHOULDER SURGERY      Social History   Socioeconomic History   Marital status: Married    Spouse name: Not on file   Number of children: Not on file   Years of education: Not on file   Highest education level: Not on file  Occupational History   Not on file  Tobacco Use   Smoking status: Never   Smokeless tobacco: Never  Vaping Use   Vaping Use: Never used  Substance and Sexual Activity   Alcohol use: Yes    Alcohol/week: 2.0 standard drinks of alcohol    Types: 1 Glasses of wine, 1 Cans of beer per week    Comment: occasional drinks.   Drug use: Never   Sexual activity: Not on file  Other Topics Concern   Not on file  Social History Narrative  Not on file   Social Determinants of Health   Financial Resource Strain: Not on file  Food Insecurity: Not on file  Transportation Needs: Not on file  Physical Activity: Not on file  Stress: Not on file  Social Connections: Not on file  Intimate Partner Violence: Not on file     History reviewed. No pertinent family history.   Current Outpatient Medications:    Cholecalciferol 50 MCG (2000 UT) CAPS, Take by mouth., Disp: , Rfl:    cyanocobalamin (VITAMIN B12) 500  MCG tablet, Take by mouth., Disp: , Rfl:    Furosemide (LASIX PO), Take by mouth., Disp: , Rfl:    gabapentin (NEURONTIN) 300 MG capsule, Take 300 mg by mouth 2 (two) times daily., Disp: , Rfl:    levothyroxine (SYNTHROID) 100 MCG tablet, Take 100 mcg by mouth daily., Disp: , Rfl:    meloxicam (MOBIC) 15 MG tablet, Take 1 tablet (15 mg total) by mouth daily., Disp: 90 tablet, Rfl: 2   rosuvastatin (CRESTOR) 10 MG tablet, Take 10 mg by mouth at bedtime., Disp: , Rfl:    baclofen (LIORESAL) 10 MG tablet, Take 1 tablet (10 mg total) by mouth 3 (three) times daily. (Patient not taking: Reported on 01/05/2022), Disp: 30 tablet, Rfl: 0   cyclobenzaprine (FLEXERIL) 10 MG tablet, Take 1 tablet (10 mg total) by mouth 3 (three) times daily as needed for muscle spasms. (Patient not taking: Reported on 01/05/2022), Disp: 30 tablet, Rfl: 0   hydrochlorothiazide (HYDRODIURIL) 25 MG tablet, Take 25 mg by mouth daily. (Patient not taking: Reported on 01/05/2022), Disp: , Rfl:    ibuprofen (ADVIL,MOTRIN) 800 MG tablet, Take 1 tablet (800 mg total) by mouth every 8 (eight) hours as needed. (Patient not taking: Reported on 01/05/2022), Disp: 30 tablet, Rfl: 0   ketorolac (TORADOL) 10 MG tablet, Take 1 tablet (10 mg total) by mouth every 6 (six) hours as needed. (Patient not taking: Reported on 01/05/2022), Disp: 20 tablet, Rfl: 0   meloxicam (MOBIC) 7.5 MG tablet, Take 7.5 mg by mouth 2 (two) times daily. (Patient not taking: Reported on 01/05/2022), Disp: , Rfl:    ranitidine (ZANTAC) 150 MG tablet, Take 1 tablet (150 mg total) by mouth 2 (two) times daily. (Patient not taking: Reported on 01/05/2022), Disp: 14 tablet, Rfl: 0   traMADol (ULTRAM) 50 MG tablet, Take 1 tablet every 8 hours by oral route. (Patient not taking: Reported on 01/05/2022), Disp: , Rfl:   Current Facility-Administered Medications:    triamcinolone acetonide (KENALOG) 10 MG/ML injection 10 mg, 10 mg, Other, Once, Wallene Huh,  [place]   Physical exam:  Vitals:   01/05/22 1102  BP: (!) 126/107  Pulse: 71  Resp: 18  Temp: 97.6 F (36.4 C)  SpO2: 99%  Weight: 204 lb 3.2 oz (92.6 kg)   Physical Exam Cardiovascular:     Rate and Rhythm: Normal rate and regular rhythm.     Heart sounds: Normal heart sounds.  Pulmonary:     Effort: Pulmonary effort is normal.     Breath sounds: Normal breath sounds.  Abdominal:     General: Bowel sounds are normal.     Palpations: Abdomen is soft.  Skin:    General: Skin is warm and dry.  Neurological:     Mental Status: She is alert and oriented to person, place, and time.           Latest Ref Rng & Units 03/19/2018    4:12 PM  CMP  Glucose 70 - 99 mg/dL 115   BUN 8 - 23 mg/dL 20   Creatinine 0.44 - 1.00 mg/dL 0.86   Sodium 135 - 145 mmol/L 138   Potassium 3.5 - 5.1 mmol/L 4.5   Chloride 98 - 111 mmol/L 102   CO2 22 - 32 mmol/L 25   Calcium 8.9 - 10.3 mg/dL 8.8       Latest Ref Rng & Units 03/19/2018    4:12 PM  CBC  WBC 4.0 - 10.5 K/uL 6.0   Hemoglobin 12.0 - 15.0 g/dL 14.1   Hematocrit 36.0 - 46.0 % 43.8   Platelets 150 - 400 K/uL 237     No images are attached to the encounter.  No results found.  Assessment and plan- Patient is a 66 y.o. female referred for iron deficiency  Labs done at her PCPs office are suggestive of iron deficiency although she is not overtly anemic.  It could be a possible combination of poor oral intake along with frequent blood donations.  We discussed oral versus IV iron.  Patient would like to proceed with IV iron.  She has had trouble tolerating oral iron in the past.  I will plan to give her 2 doses of Feraheme 510 mg IV weekly.  Discussed risks and benefits of Feraheme including all but not limited to possible risk of infusion anaphylactic reaction.  Patient understands and agrees to proceed as planned.  I will repeat CBC ferritin and iron studies in about 2 months and see her thereafter.  I have asked her to slow down  the rate of her blood donation at this time  Patient reports history of hemochromatosis in her mother.  Currently patient does not have any symptoms of iron overload.  She rather has iron deficiency.  I will plan getting her hemochromatosis testing done once her iron levels normalize   Thank you for this kind referral and the opportunity to participate in the care of this patient   Visit Diagnosis 1. Iron deficiency anemia, unspecified iron deficiency anemia type     Dr. Randa Evens, MD, MPH [facility] at [facility] [phone] 01/05/2022

## 2022-01-13 ENCOUNTER — Inpatient Hospital Stay: Payer: Medicare Other | Attending: Oncology

## 2022-01-13 VITALS — BP 122/51 | HR 65 | Temp 98.1°F

## 2022-01-13 DIAGNOSIS — D509 Iron deficiency anemia, unspecified: Secondary | ICD-10-CM | POA: Insufficient documentation

## 2022-01-13 MED ORDER — SODIUM CHLORIDE 0.9 % IV SOLN
510.0000 mg | INTRAVENOUS | Status: DC
  Administered 2022-01-13: 510 mg via INTRAVENOUS
  Filled 2022-01-13: qty 510

## 2022-01-13 MED ORDER — SODIUM CHLORIDE 0.9 % IV SOLN
Freq: Once | INTRAVENOUS | Status: AC
  Filled 2022-01-13: qty 250

## 2022-01-20 ENCOUNTER — Inpatient Hospital Stay: Payer: Medicare Other

## 2022-01-20 VITALS — BP 150/73 | HR 86 | Temp 98.9°F | Resp 18

## 2022-01-20 DIAGNOSIS — D509 Iron deficiency anemia, unspecified: Secondary | ICD-10-CM | POA: Diagnosis not present

## 2022-01-20 MED ORDER — SODIUM CHLORIDE 0.9 % IV SOLN
510.0000 mg | INTRAVENOUS | Status: DC
  Administered 2022-01-20: 510 mg via INTRAVENOUS
  Filled 2022-01-20: qty 17

## 2022-01-20 MED ORDER — SODIUM CHLORIDE 0.9 % IV SOLN
Freq: Once | INTRAVENOUS | Status: AC
  Filled 2022-01-20: qty 250

## 2022-01-20 NOTE — Patient Instructions (Signed)

## 2022-03-03 ENCOUNTER — Inpatient Hospital Stay: Payer: Medicare Other | Attending: Oncology

## 2022-03-03 DIAGNOSIS — Z7982 Long term (current) use of aspirin: Secondary | ICD-10-CM | POA: Insufficient documentation

## 2022-03-03 DIAGNOSIS — E039 Hypothyroidism, unspecified: Secondary | ICD-10-CM | POA: Diagnosis not present

## 2022-03-03 DIAGNOSIS — D509 Iron deficiency anemia, unspecified: Secondary | ICD-10-CM | POA: Insufficient documentation

## 2022-03-03 DIAGNOSIS — E785 Hyperlipidemia, unspecified: Secondary | ICD-10-CM | POA: Diagnosis not present

## 2022-03-03 DIAGNOSIS — Z79899 Other long term (current) drug therapy: Secondary | ICD-10-CM | POA: Diagnosis not present

## 2022-03-03 DIAGNOSIS — I10 Essential (primary) hypertension: Secondary | ICD-10-CM | POA: Diagnosis not present

## 2022-03-03 LAB — CBC
HCT: 43.6 % (ref 36.0–46.0)
Hemoglobin: 14.5 g/dL (ref 12.0–15.0)
MCH: 28.2 pg (ref 26.0–34.0)
MCHC: 33.3 g/dL (ref 30.0–36.0)
MCV: 84.7 fL (ref 80.0–100.0)
Platelets: 178 10*3/uL (ref 150–400)
RBC: 5.15 MIL/uL — ABNORMAL HIGH (ref 3.87–5.11)
RDW: 21.3 % — ABNORMAL HIGH (ref 11.5–15.5)
WBC: 4.6 10*3/uL (ref 4.0–10.5)
nRBC: 0 % (ref 0.0–0.2)

## 2022-03-03 LAB — IRON AND TIBC
Iron: 122 ug/dL (ref 28–170)
Saturation Ratios: 32 % — ABNORMAL HIGH (ref 10.4–31.8)
TIBC: 381 ug/dL (ref 250–450)
UIBC: 259 ug/dL

## 2022-03-03 LAB — FERRITIN: Ferritin: 53 ng/mL (ref 11–307)

## 2022-03-04 ENCOUNTER — Encounter: Payer: Self-pay | Admitting: Oncology

## 2022-03-04 ENCOUNTER — Inpatient Hospital Stay (HOSPITAL_BASED_OUTPATIENT_CLINIC_OR_DEPARTMENT_OTHER): Payer: Medicare Other | Admitting: Oncology

## 2022-03-04 DIAGNOSIS — D509 Iron deficiency anemia, unspecified: Secondary | ICD-10-CM | POA: Diagnosis not present

## 2022-03-04 LAB — VITAMIN B12: Vitamin B-12: 3849 pg/mL — ABNORMAL HIGH (ref 180–914)

## 2022-03-04 NOTE — Progress Notes (Unsigned)
Nurse called and spoke with patient, medications reviewed and updated.  Pt reports having lower back pain and states she has had "intestinal bug today"

## 2022-03-05 ENCOUNTER — Encounter: Payer: Self-pay | Admitting: Oncology

## 2022-03-05 NOTE — Progress Notes (Signed)
I connected with Rebecca Strong on 03/05/22 at  2:00 PM EST by telephone visit and verified that I am speaking with the correct person using two identifiers.   I discussed the limitations, risks, security and privacy concerns of performing an evaluation and management service by telemedicine and the availability of in-person appointments. I also discussed with the patient that there may be a patient responsible charge related to this service. The patient expressed understanding and agreed to proceed.  Other persons participating in the visit and their role in the encounter:  none  Patient's location:  home Provider's location:  home  Chief Complaint: Discuss results of blood work and routine follow-up of iron deficiency anemia  History of present illness: Patient is a 66 year old female with a past medical history significant for hypothyroidism hypertension hyperlipidemia among other medical problems. She has been referred for iron deficiency anemia. She has been a frequent blood donor about every 4 months. Most recent CBC from October 2023 showed an H&H of 12.4/40.8 with an MCV of 77. White count and platelets were normal. Ferritin level was low at 8 with an iron saturation of 9%. TIBC was elevated at 469. CMP within normal limits.  She has had colonoscopy in April 2022 which was unremarkable  Patient received IV iron in November 2023  Interval history   Patient reports some improvement in her energy levels after receiving IV iron.  She still has some ongoing fatigue.   Review of Systems  Constitutional:  Positive for malaise/fatigue. Negative for chills, fever and weight loss.  HENT:  Negative for congestion, ear discharge and nosebleeds.   Eyes:  Negative for blurred vision.  Respiratory:  Negative for cough, hemoptysis, sputum production, shortness of breath and wheezing.   Cardiovascular:  Negative for chest pain, palpitations, orthopnea and claudication.  Gastrointestinal:  Negative  for abdominal pain, blood in stool, constipation, diarrhea, heartburn, melena, nausea and vomiting.  Genitourinary:  Negative for dysuria, flank pain, frequency, hematuria and urgency.  Musculoskeletal:  Negative for back pain, joint pain and myalgias.  Skin:  Negative for rash.  Neurological:  Negative for dizziness, tingling, focal weakness, seizures, weakness and headaches.  Endo/Heme/Allergies:  Does not bruise/bleed easily.  Psychiatric/Behavioral:  Negative for depression and suicidal ideas. The patient does not have insomnia.     No Known Allergies  Past Medical History:  Diagnosis Date   Anxiety    Essential hypertension 01/05/2022   FH: hemochromatosis    Insomnia    Iron deficiency anemia    Paresthesia    Plantar fasciitis    Pure hypercholesterolemia 01/05/2022    Past Surgical History:  Procedure Laterality Date   SHOULDER SURGERY      Social History   Socioeconomic History   Marital status: Married    Spouse name: Not on file   Number of children: Not on file   Years of education: Not on file   Highest education level: Not on file  Occupational History   Not on file  Tobacco Use   Smoking status: Never   Smokeless tobacco: Never  Vaping Use   Vaping Use: Never used  Substance and Sexual Activity   Alcohol use: Yes    Alcohol/week: 2.0 standard drinks of alcohol    Types: 1 Glasses of wine, 1 Cans of beer per week    Comment: occasional drinks.   Drug use: Never   Sexual activity: Not on file  Other Topics Concern   Not on file  Social History Narrative  Not on file   Social Determinants of Health   Financial Resource Strain: Not on file  Food Insecurity: Not on file  Transportation Needs: Not on file  Physical Activity: Not on file  Stress: Not on file  Social Connections: Not on file  Intimate Partner Violence: Not on file    History reviewed. No pertinent family history.   Current Outpatient Medications:    aspirin EC 81 MG  tablet, Take 81 mg by mouth daily. Swallow whole., Disp: , Rfl:    Cholecalciferol 50 MCG (2000 UT) CAPS, Take by mouth., Disp: , Rfl:    cyanocobalamin (VITAMIN B12) 500 MCG tablet, Take by mouth., Disp: , Rfl:    Furosemide (LASIX PO), Take by mouth. Pt taking 20 mg daily, Disp: , Rfl:    gabapentin (NEURONTIN) 300 MG capsule, Take 300 mg by mouth 2 (two) times daily. Pt reports taking one daily, Disp: , Rfl:    levothyroxine (SYNTHROID) 100 MCG tablet, Take 100 mcg by mouth daily., Disp: , Rfl:    meloxicam (MOBIC) 15 MG tablet, Take 1 tablet (15 mg total) by mouth daily., Disp: 90 tablet, Rfl: 2   potassium chloride (KCL) 2 mEq/mL SOLN oral liquid, Take by mouth., Disp: , Rfl:    rosuvastatin (CRESTOR) 10 MG tablet, Take 10 mg by mouth at bedtime., Disp: , Rfl:   Current Facility-Administered Medications:    triamcinolone acetonide (KENALOG) 10 MG/ML injection 10 mg, 10 mg, Other, Once, Wallene Huh, DPM  No results found.  No images are attached to the encounter.      Latest Ref Rng & Units 03/19/2018    4:12 PM  CMP  Glucose 70 - 99 mg/dL 115   BUN 8 - 23 mg/dL 20   Creatinine 0.44 - 1.00 mg/dL 0.86   Sodium 135 - 145 mmol/L 138   Potassium 3.5 - 5.1 mmol/L 4.5   Chloride 98 - 111 mmol/L 102   CO2 22 - 32 mmol/L 25   Calcium 8.9 - 10.3 mg/dL 8.8       Latest Ref Rng & Units 03/03/2022   12:53 PM  CBC  WBC 4.0 - 10.5 K/uL 4.6   Hemoglobin 12.0 - 15.0 g/dL 14.5   Hematocrit 36.0 - 46.0 % 43.6   Platelets 150 - 400 K/uL 178     Assessment and plan: Patient is a 66 year old female and this is a routine follow-up visit for iron deficiency anemia  After receiving IV iron patient's hemoglobin has improved to 14.5 and is within normal limits.  Ferritin levels are normal at 53 with iron saturation of 32%.  B12 levels are high.  She does not require any IV iron at this time.  I will repeat CBC ferritin and iron studies in 4 and 8 months and see her back in 8 months.  If iron  deficiency recurs she will need to see GI again.  It is possible that her iron deficiency was due to frequent blood donation.  There is a family history of hemochromatosis and I will consider checking further down the line if there is evidence of increased iron saturation noted in her blood work.  Follow-up instructions:  I discussed the assessment and treatment plan with the patient. The patient was provided an opportunity to ask questions and all were answered. The patient agreed with the plan and demonstrated an understanding of the instructions.   The patient was advised to call back or seek an in-person evaluation if the symptoms  worsen or if the condition fails to improve as anticipated.  I provided 11 minutes of non face-to-face telephone visit time during this encounter, and > 50% was spent counseling as documented under my assessment & plan.  Visit Diagnosis: 1. Iron deficiency anemia, unspecified iron deficiency anemia type     Dr. Randa Evens, MD, MPH [facility] at [facility] Tel- [phone] 03/05/2022 8:06 AM

## 2022-07-03 ENCOUNTER — Other Ambulatory Visit: Payer: Self-pay

## 2022-07-03 DIAGNOSIS — D509 Iron deficiency anemia, unspecified: Secondary | ICD-10-CM

## 2022-07-06 ENCOUNTER — Inpatient Hospital Stay: Payer: Medicare Other | Attending: Oncology

## 2022-07-06 ENCOUNTER — Encounter: Payer: Self-pay | Admitting: Oncology

## 2022-07-06 DIAGNOSIS — D509 Iron deficiency anemia, unspecified: Secondary | ICD-10-CM | POA: Diagnosis present

## 2022-07-06 LAB — IRON AND TIBC
Iron: 88 ug/dL (ref 28–170)
Saturation Ratios: 18 % (ref 10.4–31.8)
TIBC: 482 ug/dL — ABNORMAL HIGH (ref 250–450)
UIBC: 394 ug/dL

## 2022-07-06 LAB — CBC WITH DIFFERENTIAL (CANCER CENTER ONLY)
Abs Immature Granulocytes: 0.01 10*3/uL (ref 0.00–0.07)
Basophils Absolute: 0 10*3/uL (ref 0.0–0.1)
Basophils Relative: 0 %
Eosinophils Absolute: 0.1 10*3/uL (ref 0.0–0.5)
Eosinophils Relative: 2 %
HCT: 44.4 % (ref 36.0–46.0)
Hemoglobin: 14.8 g/dL (ref 12.0–15.0)
Immature Granulocytes: 0 %
Lymphocytes Relative: 27 %
Lymphs Abs: 1.3 10*3/uL (ref 0.7–4.0)
MCH: 30.1 pg (ref 26.0–34.0)
MCHC: 33.3 g/dL (ref 30.0–36.0)
MCV: 90.2 fL (ref 80.0–100.0)
Monocytes Absolute: 0.3 10*3/uL (ref 0.1–1.0)
Monocytes Relative: 6 %
Neutro Abs: 3.1 10*3/uL (ref 1.7–7.7)
Neutrophils Relative %: 65 %
Platelet Count: 185 10*3/uL (ref 150–400)
RBC: 4.92 MIL/uL (ref 3.87–5.11)
RDW: 11.6 % (ref 11.5–15.5)
WBC Count: 4.8 10*3/uL (ref 4.0–10.5)
nRBC: 0 % (ref 0.0–0.2)

## 2022-07-06 LAB — FERRITIN: Ferritin: 11 ng/mL (ref 11–307)

## 2022-07-10 NOTE — Progress Notes (Signed)
Patient agrees to proceed with IV Iron. Per Shireen Quan is required for patient. Please call her to schedule. Thanks

## 2022-07-17 ENCOUNTER — Inpatient Hospital Stay: Payer: Medicare Other | Attending: Oncology

## 2022-07-17 ENCOUNTER — Other Ambulatory Visit: Payer: Self-pay | Admitting: Oncology

## 2022-07-17 VITALS — BP 158/80 | HR 64 | Temp 97.1°F | Resp 18

## 2022-07-17 DIAGNOSIS — D509 Iron deficiency anemia, unspecified: Secondary | ICD-10-CM | POA: Insufficient documentation

## 2022-07-17 MED ORDER — SODIUM CHLORIDE 0.9 % IV SOLN
Freq: Once | INTRAVENOUS | Status: AC
  Filled 2022-07-17: qty 250

## 2022-07-17 MED ORDER — SODIUM CHLORIDE 0.9 % IV SOLN
200.0000 mg | INTRAVENOUS | Status: DC
  Administered 2022-07-17: 200 mg via INTRAVENOUS
  Filled 2022-07-17: qty 200

## 2022-07-17 NOTE — Patient Instructions (Signed)

## 2022-07-23 ENCOUNTER — Telehealth: Payer: Self-pay

## 2022-07-23 NOTE — Telephone Encounter (Signed)
Left a vm for the patient to call me back for results per Dr. Smith Robert.

## 2022-07-23 NOTE — Telephone Encounter (Signed)
Patient has started Iv iron.

## 2022-07-24 ENCOUNTER — Inpatient Hospital Stay: Payer: Medicare Other

## 2022-07-24 VITALS — BP 153/65 | HR 62 | Temp 97.5°F | Resp 16

## 2022-07-24 DIAGNOSIS — D509 Iron deficiency anemia, unspecified: Secondary | ICD-10-CM | POA: Diagnosis not present

## 2022-07-24 MED ORDER — SODIUM CHLORIDE 0.9 % IV SOLN
200.0000 mg | INTRAVENOUS | Status: DC
  Administered 2022-07-24: 200 mg via INTRAVENOUS
  Filled 2022-07-24: qty 200

## 2022-07-24 MED ORDER — SODIUM CHLORIDE 0.9 % IV SOLN
Freq: Once | INTRAVENOUS | Status: AC
  Filled 2022-07-24: qty 250

## 2022-07-24 NOTE — Patient Instructions (Signed)

## 2022-07-31 ENCOUNTER — Inpatient Hospital Stay: Payer: Medicare Other

## 2022-07-31 VITALS — BP 149/63 | HR 67 | Temp 99.0°F | Resp 18

## 2022-07-31 DIAGNOSIS — D509 Iron deficiency anemia, unspecified: Secondary | ICD-10-CM

## 2022-07-31 MED ORDER — SODIUM CHLORIDE 0.9 % IV SOLN
Freq: Once | INTRAVENOUS | Status: AC
  Filled 2022-07-31: qty 250

## 2022-07-31 MED ORDER — SODIUM CHLORIDE 0.9 % IV SOLN
200.0000 mg | INTRAVENOUS | Status: DC
  Administered 2022-07-31: 200 mg via INTRAVENOUS
  Filled 2022-07-31: qty 200

## 2022-07-31 NOTE — Patient Instructions (Signed)

## 2022-08-05 ENCOUNTER — Other Ambulatory Visit: Payer: Self-pay | Admitting: *Deleted

## 2022-08-05 DIAGNOSIS — L659 Nonscarring hair loss, unspecified: Secondary | ICD-10-CM

## 2022-08-05 NOTE — Progress Notes (Signed)
Refer for hair loss

## 2022-08-06 MED FILL — Iron Sucrose Inj 20 MG/ML (Fe Equiv): INTRAVENOUS | Qty: 10 | Status: AC

## 2022-08-07 ENCOUNTER — Inpatient Hospital Stay: Payer: Medicare Other

## 2022-08-07 VITALS — BP 133/64 | HR 67 | Temp 98.2°F | Resp 18

## 2022-08-07 DIAGNOSIS — D509 Iron deficiency anemia, unspecified: Secondary | ICD-10-CM | POA: Diagnosis not present

## 2022-08-07 MED ORDER — SODIUM CHLORIDE 0.9 % IV SOLN
200.0000 mg | INTRAVENOUS | Status: DC
  Administered 2022-08-07: 200 mg via INTRAVENOUS
  Filled 2022-08-07: qty 200

## 2022-08-07 MED ORDER — SODIUM CHLORIDE 0.9 % IV SOLN
Freq: Once | INTRAVENOUS | Status: AC
  Filled 2022-08-07: qty 250

## 2022-08-14 ENCOUNTER — Inpatient Hospital Stay: Payer: Medicare Other | Attending: Oncology

## 2022-08-14 VITALS — BP 141/67 | HR 72 | Resp 18

## 2022-08-14 DIAGNOSIS — D509 Iron deficiency anemia, unspecified: Secondary | ICD-10-CM | POA: Diagnosis present

## 2022-08-14 MED ORDER — SODIUM CHLORIDE 0.9 % IV SOLN
200.0000 mg | INTRAVENOUS | Status: DC
  Administered 2022-08-14: 200 mg via INTRAVENOUS
  Filled 2022-08-14: qty 200

## 2022-08-14 MED ORDER — SODIUM CHLORIDE 0.9 % IV SOLN
Freq: Once | INTRAVENOUS | Status: AC
  Filled 2022-08-14: qty 250

## 2022-10-19 ENCOUNTER — Telehealth: Payer: Self-pay

## 2022-10-19 NOTE — Patient Outreach (Signed)
  Care Coordination   Initial Visit Note   10/19/2022 Name: Rebecca Strong MRN: [id number] DOB: 1956/03/09  Rebecca Strong is a 67 y.o. year old female who sees Hamrick, Durward Fortes, MD for primary care. I spoke with  Rebecca Strong by phone today.  What matters to the patients health and wellness today?  Placed call to patient to review and offer [facility] care coordination program. Patient reports that she is doing well and denies any needs at this time.    SDOH assessments and interventions completed:  No     Care Coordination Interventions:  No, not indicated   Follow up plan: No further intervention required.   Encounter Outcome:  Pt. Refused   Rowe Pavy, RN, BSN, CEN [facility] [phone]

## 2022-11-02 ENCOUNTER — Other Ambulatory Visit: Payer: Self-pay | Admitting: *Deleted

## 2022-11-02 DIAGNOSIS — D509 Iron deficiency anemia, unspecified: Secondary | ICD-10-CM

## 2022-11-03 ENCOUNTER — Encounter: Payer: Self-pay | Admitting: Oncology

## 2022-11-03 ENCOUNTER — Inpatient Hospital Stay: Payer: Medicare Other | Attending: Oncology

## 2022-11-03 ENCOUNTER — Inpatient Hospital Stay (HOSPITAL_BASED_OUTPATIENT_CLINIC_OR_DEPARTMENT_OTHER): Payer: Medicare Other | Admitting: Oncology

## 2022-11-03 VITALS — BP 146/86 | HR 97 | Temp 97.5°F | Resp 18 | Ht 69.0 in | Wt 189.1 lb

## 2022-11-03 DIAGNOSIS — D509 Iron deficiency anemia, unspecified: Secondary | ICD-10-CM | POA: Diagnosis present

## 2022-11-03 LAB — CBC
HCT: 45.5 % (ref 36.0–46.0)
Hemoglobin: 15.4 g/dL — ABNORMAL HIGH (ref 12.0–15.0)
MCH: 31.7 pg (ref 26.0–34.0)
MCHC: 33.8 g/dL (ref 30.0–36.0)
MCV: 93.6 fL (ref 80.0–100.0)
Platelets: 166 10*3/uL (ref 150–400)
RBC: 4.86 MIL/uL (ref 3.87–5.11)
RDW: 12 % (ref 11.5–15.5)
WBC: 4.8 10*3/uL (ref 4.0–10.5)
nRBC: 0 % (ref 0.0–0.2)

## 2022-11-03 LAB — FERRITIN: Ferritin: 78 ng/mL (ref 11–307)

## 2022-11-03 LAB — IRON AND TIBC
Iron: 143 ug/dL (ref 28–170)
Saturation Ratios: 36 % — ABNORMAL HIGH (ref 10.4–31.8)
TIBC: 403 ug/dL (ref 250–450)
UIBC: 260 ug/dL

## 2022-11-03 NOTE — Progress Notes (Signed)
Hematology/Oncology Consult note [facility]  Telephone:([phone] [phone]  Patient Care Team: Ailene Ravel, MD as PCP - General (Family Medicine) Creig Hines, MD as Consulting Physician (Oncology)   Name of the patient: Rebecca Strong  [id number]  1955-04-19   Date of visit: 11/03/22  Diagnosis-iron deficiency anemia  Chief complaint/ Reason for visit-routine follow-up of iron deficiency anemia  Heme/Onc history: Patient is a 67 year old female with a past medical history significant for hypothyroidism hypertension hyperlipidemia among other medical problems. She has been referred for iron deficiency anemia. She has been a frequent blood donor about every 4 months. Most recent CBC from October 2023 showed an H&H of 12.4/40.8 with an MCV of 77. White count and platelets were normal. Ferritin level was low at 8 with an iron saturation of 9%. TIBC was elevated at 469. CMP within normal limits.  She has had colonoscopy in April 2022 which was unremarkable   Patient received IV iron in November 2023  Interval history-hair fall has decreased after receiving IV iron.  Otherwise she has not noticed any major difference after receiving IV iron.  Fatigue is stable to better.  ECOG PS- 0 Pain scale- 0   Review of systems- Review of Systems  Constitutional:  Negative for chills, fever, malaise/fatigue and weight loss.  HENT:  Negative for congestion, ear discharge and nosebleeds.   Eyes:  Negative for blurred vision.  Respiratory:  Negative for cough, hemoptysis, sputum production, shortness of breath and wheezing.   Cardiovascular:  Negative for chest pain, palpitations, orthopnea and claudication.  Gastrointestinal:  Negative for abdominal pain, blood in stool, constipation, diarrhea, heartburn, melena, nausea and vomiting.  Genitourinary:  Negative for dysuria, flank pain, frequency, hematuria and urgency.  Musculoskeletal:  Negative for  back pain, joint pain and myalgias.  Skin:  Negative for rash.  Neurological:  Negative for dizziness, tingling, focal weakness, seizures, weakness and headaches.  Endo/Heme/Allergies:  Does not bruise/bleed easily.  Psychiatric/Behavioral:  Negative for depression and suicidal ideas. The patient does not have insomnia.       No Known Allergies   Past Medical History:  Diagnosis Date   Anxiety    Essential hypertension 01/05/2022   FH: hemochromatosis    Insomnia    Iron deficiency anemia    Paresthesia    Plantar fasciitis    Pure hypercholesterolemia 01/05/2022     Past Surgical History:  Procedure Laterality Date   SHOULDER SURGERY      Social History   Socioeconomic History   Marital status: Married    Spouse name: Not on file   Number of children: Not on file   Years of education: Not on file   Highest education level: Not on file  Occupational History   Not on file  Tobacco Use   Smoking status: Never   Smokeless tobacco: Never  Vaping Use   Vaping status: Never Used  Substance and Sexual Activity   Alcohol use: Yes    Alcohol/week: 2.0 standard drinks of alcohol    Types: 1 Glasses of wine, 1 Cans of beer per week    Comment: occasional drinks.   Drug use: Never   Sexual activity: Not on file  Other Topics Concern   Not on file  Social History Narrative   Not on file   Social Determinants of Health   Financial Resource Strain: Not on file  Food Insecurity: Not on file  Transportation Needs: Not on file  Physical  Activity: Not on file  Stress: Not on file  Social Connections: Not on file  Intimate Partner Violence: Not on file    History reviewed. No pertinent family history.   Current Outpatient Medications:    aspirin EC 81 MG tablet, Take 81 mg by mouth daily. Swallow whole., Disp: , Rfl:    Cholecalciferol 50 MCG (2000 UT) CAPS, Take by mouth., Disp: , Rfl:    cyanocobalamin (VITAMIN B12) 500 MCG tablet, Take by mouth., Disp: , Rfl:     Furosemide (LASIX PO), Take by mouth. Pt taking 20 mg daily, Disp: , Rfl:    gabapentin (NEURONTIN) 300 MG capsule, Take 300 mg by mouth 2 (two) times daily. Pt reports taking one daily, Disp: , Rfl:    levothyroxine (SYNTHROID) 100 MCG tablet, Take 100 mcg by mouth daily., Disp: , Rfl:    meloxicam (MOBIC) 15 MG tablet, Take 1 tablet (15 mg total) by mouth daily., Disp: 90 tablet, Rfl: 2   potassium chloride (KCL) 2 mEq/mL SOLN oral liquid, Take by mouth., Disp: , Rfl:    rosuvastatin (CRESTOR) 10 MG tablet, Take 10 mg by mouth at bedtime., Disp: , Rfl:   Current Facility-Administered Medications:    triamcinolone acetonide (KENALOG) 10 MG/ML injection 10 mg, 10 mg, Other, Once, Lenn Sink, [place]  Physical exam:  Vitals:   11/03/22 1120  BP: (!) 146/86  Pulse: 97  Resp: 18  Temp: (!) 97.5 F (36.4 C)  TempSrc: Tympanic  SpO2: 97%  Weight: 189 lb 1.6 oz (85.8 kg)  Height: 5\' 9"  (1.753 m)   Physical Exam Cardiovascular:     Rate and Rhythm: Normal rate and regular rhythm.     Heart sounds: Normal heart sounds.  Pulmonary:     Effort: Pulmonary effort is normal.  Skin:    General: Skin is warm and dry.  Neurological:     Mental Status: She is alert and oriented to person, place, and time.         Latest Ref Rng & Units 03/19/2018    4:12 PM  CMP  Glucose 70 - 99 mg/dL 562   BUN 8 - 23 mg/dL 20   Creatinine 1.30 - 1.00 mg/dL 8.65   Sodium 784 - 696 mmol/L 138   Potassium 3.5 - 5.1 mmol/L 4.5   Chloride 98 - 111 mmol/L 102   CO2 22 - 32 mmol/L 25   Calcium 8.9 - 10.3 mg/dL 8.8       Latest Ref Rng & Units 11/03/2022   11:01 AM  CBC  WBC 4.0 - 10.5 K/uL 4.8   Hemoglobin 12.0 - 15.0 g/dL 29.5   Hematocrit 28.4 - 46.0 % 45.5   Platelets 150 - 400 K/uL 166      Assessment and plan- Patient is a 67 y.o. female here for routine follow-up of iron deficiency anemia  Patient does notPresently anemic and her hemoglobin is 15.  Iron studies are within normal limits  and she does not require any IV iron at this time.  I will repeat CBC ferritin and iron studies in 3 in 6 months and see her back in 6 months.  Patient can continue to take oral iron over-the-counter every other day as tolerated.  If patient were to again develop iron deficiency anemia in the future we will need to refer her for GI workup.  She does donate blood about every 4 months and this should typically not lead to significant iron deficiency.   Visit  Diagnosis 1. Iron deficiency anemia, unspecified iron deficiency anemia type      Dr. Owens Shark, MD, MPH [facility] at [facility] [phone] 11/03/2022 2:27 PM

## 2023-01-27 ENCOUNTER — Encounter: Payer: Self-pay | Admitting: Oncology

## 2023-02-03 ENCOUNTER — Inpatient Hospital Stay: Payer: Medicare Other | Attending: Oncology

## 2023-02-03 DIAGNOSIS — D509 Iron deficiency anemia, unspecified: Secondary | ICD-10-CM | POA: Insufficient documentation

## 2023-02-03 LAB — IRON AND TIBC
Iron: 87 ug/dL (ref 28–170)
Saturation Ratios: 21 % (ref 10.4–31.8)
TIBC: 417 ug/dL (ref 250–450)
UIBC: 330 ug/dL

## 2023-02-03 LAB — CBC
HCT: 43.1 % (ref 36.0–46.0)
Hemoglobin: 14.5 g/dL (ref 12.0–15.0)
MCH: 31.9 pg (ref 26.0–34.0)
MCHC: 33.6 g/dL (ref 30.0–36.0)
MCV: 94.9 fL (ref 80.0–100.0)
Platelets: 182 10*3/uL (ref 150–400)
RBC: 4.54 MIL/uL (ref 3.87–5.11)
RDW: 12 % (ref 11.5–15.5)
WBC: 7.2 10*3/uL (ref 4.0–10.5)
nRBC: 0 % (ref 0.0–0.2)

## 2023-02-03 LAB — FERRITIN: Ferritin: 44 ng/mL (ref 11–307)

## 2023-02-12 ENCOUNTER — Telehealth: Payer: Self-pay | Admitting: *Deleted

## 2023-02-12 NOTE — Telephone Encounter (Signed)
Patient called stating that no one called her about her recent labs results and would like a return call  CBC Order: [id number] Status: Final result     Visible to patient: No (inaccessible in MyChart)     Next appt: 05/07/2023 at 11:15 AM in Oncology ([facility])     Dx: Iron deficiency anemia, unspecified i...   0 Result Notes        Component Ref Range & Units 9 d ago 3 mo ago 7 mo ago 11 mo ago 4 yr ago  WBC 4.0 - 10.5 K/uL 7.2 4.8 4.8 4.6 6.0  RBC 3.87 - 5.11 MIL/uL 4.54 4.86 4.92 5.15 High  5.05  Hemoglobin 12.0 - 15.0 g/dL 84.1 66.0 High  63.0 16.0 14.1  HCT 36.0 - 46.0 % 43.1 45.5 44.4 43.6 43.8  MCV 80.0 - 100.0 fL 94.9 93.6 90.2 84.7 86.7  MCH 26.0 - 34.0 pg 31.9 31.7 30.1 28.2 27.9  MCHC 30.0 - 36.0 g/dL 10.9 32.3 55.7 32.2 02.5  RDW 11.5 - 15.5 % 12.0 12.0 11.6 21.3 High  13.7  Platelets 150 - 400 K/uL 182 166 185 178 237  nRBC 0.0 - 0.2 % 0.0 0.0 CM 0.0 0.0 CM 0.0 CM  Comment: Performed at [facility], [address] [place], [place] [address]  Neutrophils Relative %   65 R    Basophils Absolute   0.0 R    Immature Granulocytes   0 R    Abs Immature Granulocytes   0.01 R, CM    Neutro Abs   3.1 R    Lymphocytes Relative   27 R    Lymphs Abs   1.3 R    Monocytes Relative   6 R    Monocytes Absolute   0.3 R    Eosinophils Relative   2 R    Eosinophils Absolute   0.1 R    Basophils Relative   0 R    Resulting Agency [facility]     Other Results from 02/03/2023  Iron and TIBC Order: [id number] Status: Final result           Component Ref Range & Units 9 d ago 3 mo ago 7 mo ago 11 mo ago  Iron 28 - 170 ug/dL 87 176 88 160  TIBC 737 - 450 ug/dL 106 269 485 High  462  Saturation Ratios 10.4 - 31.8 % 21 36 High  18 32 High   UIBC ug/dL 703 500 CM 938 CM 182 CM  Comment: Performed at [facility], [address] [place], [place] [address]  Resulting Agency [facility]     Ferritin Order: [id number] Status: Final result      Visible to patient: No (inaccessible in MyChart)      Next appt: 05/07/2023 at 11:15 AM in Oncology ([facility])      Dx: Iron deficiency anemia, unspecified i...    0 Result Notes         Component Ref Range & Units 9 d ago 3 mo ago 7 mo ago 11 mo ago  Ferritin 11 - 307 ng/mL 44 78 CM 11 CM 53 CM  Comment: Performed at [facility], [address] [place], [place] [address]  Resulting Agency [facility]  Specimen Collected: 02/03/23 11:04 Last Resulted: 02/03/23 14:36

## 2023-02-14 NOTE — Telephone Encounter (Signed)
She does not need IV iron. I had asked Tori to call her and tell her that her hb, iron studies are normal. Ferritin of 44 is acceptable

## 2023-02-16 ENCOUNTER — Encounter: Payer: Self-pay | Admitting: Oncology

## 2023-05-05 ENCOUNTER — Other Ambulatory Visit: Payer: Self-pay

## 2023-05-05 DIAGNOSIS — D509 Iron deficiency anemia, unspecified: Secondary | ICD-10-CM

## 2023-05-07 ENCOUNTER — Inpatient Hospital Stay: Payer: Medicare Other | Attending: Oncology

## 2023-05-07 ENCOUNTER — Encounter: Payer: Self-pay | Admitting: Oncology

## 2023-05-07 ENCOUNTER — Inpatient Hospital Stay (HOSPITAL_BASED_OUTPATIENT_CLINIC_OR_DEPARTMENT_OTHER): Payer: Medicare Other | Admitting: Oncology

## 2023-05-07 ENCOUNTER — Ambulatory Visit: Payer: Medicare Other

## 2023-05-07 VITALS — BP 143/80 | HR 66 | Temp 97.7°F | Resp 18 | Wt 189.0 lb

## 2023-05-07 DIAGNOSIS — Z79899 Other long term (current) drug therapy: Secondary | ICD-10-CM | POA: Insufficient documentation

## 2023-05-07 DIAGNOSIS — L659 Nonscarring hair loss, unspecified: Secondary | ICD-10-CM | POA: Diagnosis not present

## 2023-05-07 DIAGNOSIS — R21 Rash and other nonspecific skin eruption: Secondary | ICD-10-CM | POA: Diagnosis not present

## 2023-05-07 DIAGNOSIS — D509 Iron deficiency anemia, unspecified: Secondary | ICD-10-CM | POA: Insufficient documentation

## 2023-05-07 LAB — CBC (CANCER CENTER ONLY)
HCT: 38.7 % (ref 36.0–46.0)
Hemoglobin: 13 g/dL (ref 12.0–15.0)
MCH: 31 pg (ref 26.0–34.0)
MCHC: 33.6 g/dL (ref 30.0–36.0)
MCV: 92.4 fL (ref 80.0–100.0)
Platelet Count: 165 10*3/uL (ref 150–400)
RBC: 4.19 MIL/uL (ref 3.87–5.11)
RDW: 12.5 % (ref 11.5–15.5)
WBC Count: 3.6 10*3/uL — ABNORMAL LOW (ref 4.0–10.5)
nRBC: 0 % (ref 0.0–0.2)

## 2023-05-07 LAB — IRON AND TIBC
Iron: 64 ug/dL (ref 28–170)
Saturation Ratios: 14 % (ref 10.4–31.8)
TIBC: 456 ug/dL — ABNORMAL HIGH (ref 250–450)
UIBC: 392 ug/dL

## 2023-05-07 LAB — FERRITIN: Ferritin: 14 ng/mL (ref 11–307)

## 2023-05-07 NOTE — Addendum Note (Signed)
 Addended by: Ashley Royalty A on: 05/07/2023 01:19 PM   Modules accepted: Orders

## 2023-05-07 NOTE — Progress Notes (Signed)
 Hematology/Oncology Consult note [facility]  [phone] [phone]  Patient Care Team: Ailene Ravel, MD as PCP - General (Family Medicine) Creig Hines, MD as Consulting Physician (Oncology)   Name of the patient: Rebecca Strong  [id number]  30-Dec-1955   Date of visit: 05/07/23  Diagnosis-iron deficiency anemia  Chief complaint/ Reason for visit-routine follow-up of iron deficiency anemia  Heme/Onc history: Patient is a 68 year old female with a past medical history significant for hypothyroidism hypertension hyperlipidemia among other medical problems. She has been referred for iron deficiency anemia. She has been a frequent blood donor about every 4 months. Most recent CBC from October 2023 showed an H&H of 12.4/40.8 with an MCV of 77. White count and platelets were normal. Ferritin level was low at 8 with an iron saturation of 9%. TIBC was elevated at 469. CMP within normal limits.  She has had colonoscopy in April 2022 which was unremarkable.   Patient last received IV iron in June 2024.  Interval history-she presently states that her hair loss has been more than usual which she usually experiences during iron deficiency.  Also has significant myalgias intermittently.  She has ongoing right shoulder pain for which she has seen orthopedics and will be started on oral steroids.  She develops on and off blisters on her forearms with subsequently burst and healed with hyperpigmentation.  ECOG PS- 1 Pain scale- 3   Review of systems- Review of Systems  Constitutional:  Positive for malaise/fatigue. Negative for chills, fever and weight loss.       Hair loss  HENT:  Negative for congestion, ear discharge and nosebleeds.   Eyes:  Negative for blurred vision.  Respiratory:  Negative for cough, hemoptysis, sputum production, shortness of breath and wheezing.   Cardiovascular:  Negative for chest pain, palpitations, orthopnea and  claudication.  Gastrointestinal:  Negative for abdominal pain, blood in stool, constipation, diarrhea, heartburn, melena, nausea and vomiting.  Genitourinary:  Negative for dysuria, flank pain, frequency, hematuria and urgency.  Musculoskeletal:  Negative for back pain, joint pain and myalgias.  Skin:  Positive for rash.  Neurological:  Negative for dizziness, tingling, focal weakness, seizures, weakness and headaches.  Endo/Heme/Allergies:  Does not bruise/bleed easily.  Psychiatric/Behavioral:  Negative for depression and suicidal ideas. The patient does not have insomnia.       No Known Allergies   Past Medical History:  Diagnosis Date   Anxiety    Essential hypertension 01/05/2022   FH: hemochromatosis    Insomnia    Iron deficiency anemia    Paresthesia    Plantar fasciitis    Pure hypercholesterolemia 01/05/2022     Past Surgical History:  Procedure Laterality Date   SHOULDER SURGERY      Social History   Socioeconomic History   Marital status: Married    Spouse name: Not on file   Number of children: Not on file   Years of education: Not on file   Highest education level: Not on file  Occupational History   Not on file  Tobacco Use   Smoking status: Never   Smokeless tobacco: Never  Vaping Use   Vaping status: Never Used  Substance and Sexual Activity   Alcohol use: Yes    Alcohol/week: 2.0 standard drinks of alcohol    Types: 1 Glasses of wine, 1 Cans of beer per week    Comment: occasional drinks.   Drug use: Never   Sexual activity: Not on file  Other Topics Concern   Not on file  Social History Narrative   Not on file   Social Drivers of Health   Financial Resource Strain: Not on file  Food Insecurity: Not on file  Transportation Needs: Not on file  Physical Activity: Not on file  Stress: Not on file  Social Connections: Not on file  Intimate Partner Violence: Not on file    History reviewed. No pertinent family history.   Current  Outpatient Medications:    aspirin EC 81 MG tablet, Take 81 mg by mouth daily. Swallow whole., Disp: , Rfl:    Cholecalciferol 50 MCG (2000 UT) CAPS, Take by mouth., Disp: , Rfl:    cyanocobalamin (VITAMIN B12) 500 MCG tablet, Take by mouth., Disp: , Rfl:    Furosemide (LASIX PO), Take by mouth. Pt taking 20 mg daily, Disp: , Rfl:    gabapentin (NEURONTIN) 300 MG capsule, Take 300 mg by mouth 2 (two) times daily. Pt reports taking one daily, Disp: , Rfl:    levothyroxine (SYNTHROID) 100 MCG tablet, Take 100 mcg by mouth daily., Disp: , Rfl:    meloxicam (MOBIC) 15 MG tablet, Take 1 tablet (15 mg total) by mouth daily., Disp: 90 tablet, Rfl: 2   potassium chloride (KCL) 2 mEq/mL SOLN oral liquid, Take by mouth., Disp: , Rfl:    rosuvastatin (CRESTOR) 10 MG tablet, Take 10 mg by mouth at bedtime., Disp: , Rfl:   Current Facility-Administered Medications:    triamcinolone acetonide (KENALOG) 10 MG/ML injection 10 mg, 10 mg, Other, Once, Lenn Sink, [place]  Physical exam:  Vitals:   05/07/23 1150  BP: (!) 143/80  Pulse: 66  Resp: 18  Temp: 97.7 F (36.5 C)  TempSrc: Tympanic  SpO2: 98%  Weight: 189 lb (85.7 kg)   Physical Exam Cardiovascular:     Rate and Rhythm: Normal rate and regular rhythm.     Heart sounds: Normal heart sounds.  Pulmonary:     Effort: Pulmonary effort is normal.     Breath sounds: Normal breath sounds.  Abdominal:     General: Bowel sounds are normal.     Palpations: Abdomen is soft.  Skin:    Comments: There are scattered subcentimeter hyperpigmented lesions noted over bilateral forearms  Neurological:     Mental Status: She is alert and oriented to person, place, and time.         Latest Ref Rng & Units 03/19/2018    4:12 PM  CMP  Glucose 70 - 99 mg/dL 147   BUN 8 - 23 mg/dL 20   Creatinine 8.29 - 1.00 mg/dL 5.62   Sodium 130 - 865 mmol/L 138   Potassium 3.5 - 5.1 mmol/L 4.5   Chloride 98 - 111 mmol/L 102   CO2 22 - 32 mmol/L 25   Calcium 8.9  - 10.3 mg/dL 8.8       Latest Ref Rng & Units 05/07/2023   11:32 AM  CBC  WBC 4.0 - 10.5 K/uL 3.6   Hemoglobin 12.0 - 15.0 g/dL 78.4   Hematocrit 69.6 - 46.0 % 38.7   Platelets 150 - 400 K/uL 165     No images are attached to the encounter.  No results found.   Assessment and plan- Patient is a 68 y.o. female here for a routine follow-up of iron deficiency anemia  Patient is not presently anemic with an H&H of 13/38.7.  No evidence of microcytosis.  IronStudies are currently pending.  CBC ferritin and  iron studies in 3 and 6 months and I will see her back in 6 months.  Of iron deficiency recurs she will need to see GI again and consider EGD at that time  With regards to her hair loss-this may or may not be related to iron deficiency.  If her iron levels are normal I would still recommend seeing a dermatologist to evaluate this further.  Skin rash: Etiology unclear.  Have asked her to speak to her primary care doctor more about it but I am checking ANA comprehensive panel today and if it is positive I will refer her to rheumatology   Visit Diagnosis 1. Iron deficiency anemia, unspecified iron deficiency anemia type      Dr. Owens Shark, MD, MPH [facility] at [facility] [phone] 05/07/2023 12:33 PM

## 2023-05-08 LAB — ANA W/REFLEX: Anti Nuclear Antibody (ANA): NEGATIVE

## 2023-05-12 ENCOUNTER — Encounter: Payer: Self-pay | Admitting: Internal Medicine

## 2023-05-14 ENCOUNTER — Telehealth: Payer: Self-pay

## 2023-05-14 ENCOUNTER — Inpatient Hospital Stay: Payer: Medicare Other

## 2023-05-14 NOTE — Telephone Encounter (Signed)
 Called informed

## 2023-05-14 NOTE — Telephone Encounter (Signed)
-----   Message from Creig Hines sent at 05/13/2023  8:31 AM EST ----- Please let her know her auto,,ume screening test (ANA) was negative

## 2023-05-18 ENCOUNTER — Inpatient Hospital Stay: Attending: Oncology

## 2023-05-18 ENCOUNTER — Encounter: Payer: Self-pay | Admitting: Oncology

## 2023-05-18 ENCOUNTER — Other Ambulatory Visit: Payer: Self-pay | Admitting: Oncology

## 2023-05-18 VITALS — BP 137/71 | HR 57 | Temp 99.0°F | Resp 18

## 2023-05-18 DIAGNOSIS — D509 Iron deficiency anemia, unspecified: Secondary | ICD-10-CM | POA: Insufficient documentation

## 2023-05-18 MED ORDER — SODIUM CHLORIDE 0.9 % IV SOLN
INTRAVENOUS | Status: DC
  Filled 2023-05-18: qty 250

## 2023-05-18 MED ORDER — SODIUM CHLORIDE 0.9 % IV SOLN
1000.0000 mg | Freq: Once | INTRAVENOUS | Status: AC
  Administered 2023-05-18: 1000 mg via INTRAVENOUS
  Filled 2023-05-18: qty 1000

## 2023-07-19 ENCOUNTER — Encounter: Payer: Self-pay | Admitting: Oncology

## 2023-07-30 ENCOUNTER — Encounter: Payer: Self-pay | Admitting: Oncology

## 2023-08-06 ENCOUNTER — Inpatient Hospital Stay: Payer: Medicare Other | Attending: Oncology

## 2023-08-06 DIAGNOSIS — D509 Iron deficiency anemia, unspecified: Secondary | ICD-10-CM | POA: Insufficient documentation

## 2023-08-06 LAB — IRON AND TIBC
Iron: 150 ug/dL (ref 28–170)
Saturation Ratios: 43 % — ABNORMAL HIGH (ref 10.4–31.8)
TIBC: 351 ug/dL (ref 250–450)
UIBC: 201 ug/dL

## 2023-08-06 LAB — CBC WITH DIFFERENTIAL (CANCER CENTER ONLY)
Abs Immature Granulocytes: 0.02 10*3/uL (ref 0.00–0.07)
Basophils Absolute: 0 10*3/uL (ref 0.0–0.1)
Basophils Relative: 1 %
Eosinophils Absolute: 0.1 10*3/uL (ref 0.0–0.5)
Eosinophils Relative: 2 %
HCT: 45.5 % (ref 36.0–46.0)
Hemoglobin: 15.5 g/dL — ABNORMAL HIGH (ref 12.0–15.0)
Immature Granulocytes: 1 %
Lymphocytes Relative: 29 %
Lymphs Abs: 1.1 10*3/uL (ref 0.7–4.0)
MCH: 32.2 pg (ref 26.0–34.0)
MCHC: 34.1 g/dL (ref 30.0–36.0)
MCV: 94.6 fL (ref 80.0–100.0)
Monocytes Absolute: 0.3 10*3/uL (ref 0.1–1.0)
Monocytes Relative: 9 %
Neutro Abs: 2.2 10*3/uL (ref 1.7–7.7)
Neutrophils Relative %: 58 %
Platelet Count: 160 10*3/uL (ref 150–400)
RBC: 4.81 MIL/uL (ref 3.87–5.11)
RDW: 12.4 % (ref 11.5–15.5)
WBC Count: 3.8 10*3/uL — ABNORMAL LOW (ref 4.0–10.5)
nRBC: 0 % (ref 0.0–0.2)

## 2023-08-06 LAB — FERRITIN: Ferritin: 194 ng/mL (ref 11–307)

## 2023-08-10 ENCOUNTER — Ambulatory Visit: Payer: Medicare Other | Admitting: Dermatology

## 2023-10-12 ENCOUNTER — Emergency Department
Admission: EM | Admit: 2023-10-12 | Discharge: 2023-10-12 | Disposition: A | Discharge status: Home / Self Care | Attending: Emergency Medicine | Admitting: Emergency Medicine

## 2023-10-12 ENCOUNTER — Emergency Department

## 2023-10-12 DIAGNOSIS — R42 Dizziness and giddiness: Secondary | ICD-10-CM | POA: Diagnosis present

## 2023-10-12 DIAGNOSIS — R519 Headache, unspecified: Secondary | ICD-10-CM | POA: Insufficient documentation

## 2023-10-12 DIAGNOSIS — R9082 White matter disease, unspecified: Secondary | ICD-10-CM | POA: Insufficient documentation

## 2023-10-12 DIAGNOSIS — I10 Essential (primary) hypertension: Secondary | ICD-10-CM | POA: Insufficient documentation

## 2023-10-12 LAB — COMPREHENSIVE METABOLIC PANEL WITH GFR
ALT: 23 U/L (ref 0–44)
AST: 25 U/L (ref 15–41)
Albumin: 3.9 g/dL (ref 3.5–5.0)
Alkaline Phosphatase: 82 U/L (ref 38–126)
Anion gap: 12 (ref 5–15)
BUN: 11 mg/dL (ref 8–23)
CO2: 25 mmol/L (ref 22–32)
Calcium: 9.7 mg/dL (ref 8.9–10.3)
Chloride: 104 mmol/L (ref 98–111)
Creatinine, Ser: 0.83 mg/dL (ref 0.44–1.00)
GFR, Estimated: >60 mL/min (ref >60)
Glucose, Bld: 106 mg/dL — ABNORMAL HIGH (ref 70–99)
Potassium: 3.9 mmol/L (ref 3.5–5.1)
Sodium: 141 mmol/L (ref 135–145)
Total Bilirubin: 0.8 mg/dL (ref 0.0–1.2)
Total Protein: 6.7 g/dL (ref 6.5–8.1)

## 2023-10-12 LAB — CBC
HCT: 40.3 % (ref 36.0–46.0)
Hemoglobin: 14 g/dL (ref 12.0–15.0)
MCH: 32.3 pg (ref 26.0–34.0)
MCHC: 34.7 g/dL (ref 30.0–36.0)
MCV: 93.1 fL (ref 80.0–100.0)
Platelets: 181 K/uL (ref 150–400)
RBC: 4.33 MIL/uL (ref 3.87–5.11)
RDW: 11.5 % (ref 11.5–15.5)
WBC: 3.8 K/uL — ABNORMAL LOW (ref 4.0–10.5)
nRBC: 0 % (ref 0.0–0.2)

## 2023-10-12 LAB — DIFFERENTIAL
Abs Immature Granulocytes: 0.02 K/uL (ref 0.00–0.07)
Basophils Absolute: 0 K/uL (ref 0.0–0.1)
Basophils Relative: 1 %
Eosinophils Absolute: 0.1 K/uL (ref 0.0–0.5)
Eosinophils Relative: 2 %
Immature Granulocytes: 1 %
Lymphocytes Relative: 28 %
Lymphs Abs: 1.1 K/uL (ref 0.7–4.0)
Monocytes Absolute: 0.3 K/uL (ref 0.1–1.0)
Monocytes Relative: 9 %
Neutro Abs: 2.3 K/uL (ref 1.7–7.7)
Neutrophils Relative %: 59 %

## 2023-10-12 LAB — PROTIME-INR
INR: 1 (ref 0.8–1.2)
Prothrombin Time: 13.8 s (ref 11.4–15.2)

## 2023-10-12 LAB — ETHANOL: Alcohol, Ethyl (B): <15 mg/dL (ref <15)

## 2023-10-12 LAB — APTT: aPTT: 28 s (ref 24–36)

## 2023-10-12 MED ORDER — MECLIZINE HCL 25 MG PO TABS
25.0000 mg | ORAL_TABLET | Freq: Three times a day (TID) | ORAL | 0 refills | Status: AC | PRN
Start: 2023-10-12 — End: ?

## 2023-10-12 MED ORDER — ONDANSETRON 4 MG PO TBDP: 4.0000 mg | ORAL_TABLET | Freq: Three times a day (TID) | ORAL | 0 refills | Status: AC | PRN

## 2023-10-12 NOTE — ED Provider Notes (Signed)
 [facility] Provider Note    Event Date/Time   First MD Initiated Contact with Patient 10/12/23 0935     (approximate)   History   Dizziness and Gait Problem   HPI  Rebecca Strong is a 68 y.o. female with a history of hypertension, left knee arthroscopy 2 weeks ago who presents with complaints of dizziness.  Patient reports around 4:30 AM she got up to go to the bathroom.  She reports feeling quite dizzy like the room was moving and when she walked her right leg was stutter step.  She had to hold onto a wall.  No history of vertigo     Physical Exam   Triage Vital Signs: ED Triage Vitals  Encounter Vitals Group     BP 10/12/23 0929 133/82     Girls Systolic BP Percentile --      Girls Diastolic BP Percentile --      Boys Systolic BP Percentile --      Boys Diastolic BP Percentile --      Pulse Rate 10/12/23 0929 61     Resp 10/12/23 0929 18     Temp 10/12/23 0929 98.8 F (37.1 C)     Temp Source 10/12/23 0929 Oral     SpO2 10/12/23 0929 98 %     Weight 10/12/23 0931 83.9 kg (185 lb)     Height 10/12/23 0931 1.753 m (5' 9)     Head Circumference --      Peak Flow --      Pain Score 10/12/23 0930 0     Pain Loc --      Pain Education --      Exclude from Growth Chart --     Most recent vital signs: Vitals:   10/12/23 1230 10/12/23 1300  BP: (!) 183/66 (!) 186/73  Pulse: (!) 57 64  Resp: 18 14  Temp:    SpO2: 100% 100%     General: Awake, no distress.  CV:  Good peripheral perfusion.  Resp:  Normal effort.  Abd:  No distention.  Other:  Moving all extremities equally, cranial nerves II through XII are normal, no dysdiadochokinesis   ED Results / Procedures / Treatments   Labs (all labs ordered are listed, but only abnormal results are displayed) Labs Reviewed  CBC - Abnormal; Notable for the following components:      Result Value   WBC 3.8 (*)    All other components within normal limits  COMPREHENSIVE METABOLIC  PANEL WITH GFR - Abnormal; Notable for the following components:   Glucose, Bld 106 (*)    All other components within normal limits  PROTIME-INR  APTT  DIFFERENTIAL  ETHANOL     EKG     RADIOLOGY CT head without acute abnormality    PROCEDURES:  Critical Care performed:   Procedures   MEDICATIONS ORDERED IN ED: Medications - No data to display   IMPRESSION / MDM / ASSESSMENT AND PLAN / ED COURSE  I reviewed the triage vital signs and the nursing notes. Patient's presentation is most consistent with acute presentation with potential threat to life or bodily function.  Patient presents with symptoms as above, differential includes CVA versus BPV versus dehydration  Will send for CT head, labs pending.  Last known well was 11 PM last night, she is outside the window for TNK  Lab work is overall quite reassuring  CT scan is reassuring, will send for MRI  ----------------------------------------- 2:31  PM on 10/12/2023 ----------------------------------------- MRI is negative for acute stroke, no significant change from prior in 2021  Patient is feeling improved, suspect vertigo, will discharge with meclizine  and Zofran , outpatient follow-up with the ENT, strict return precautions, she agrees with this plan.      FINAL CLINICAL IMPRESSION(S) / ED DIAGNOSES   Final diagnoses:  Vertigo     Rx / DC Orders   ED Discharge Orders          Ordered    meclizine  (ANTIVERT ) 25 MG tablet  3 times daily PRN        10/12/23 1431    ondansetron  (ZOFRAN -ODT) 4 MG disintegrating tablet  Every 8 hours PRN        10/12/23 1431             Note:  This document was prepared using Dragon voice recognition software and may include unintentional dictation errors.   Arlander Charleston, MD 10/12/23 [phone]

## 2023-10-12 NOTE — ED Triage Notes (Signed)
 Pt c/o dizziness and unsteady gait started w/ waking this morning around 0430 and intermittent R side headache.  LKW 2300.  Pt reports she is 2 week post op L knee arthroscopy.  Pt reports she feels like she is leaning to the R.      Facial symmetry and clear speech noted.  Denies extremity weakness.

## 2023-10-12 NOTE — ED Notes (Signed)
 Patient in MRI

## 2023-10-15 ENCOUNTER — Telehealth: Payer: Self-pay | Admitting: Oncology

## 2023-10-15 NOTE — Telephone Encounter (Signed)
 MD will not be here 11/05/23.  Left vm for pt to call back to r/s appts. Scheduling phone number given

## 2023-10-22 ENCOUNTER — Inpatient Hospital Stay

## 2023-10-22 ENCOUNTER — Inpatient Hospital Stay (HOSPITAL_BASED_OUTPATIENT_CLINIC_OR_DEPARTMENT_OTHER): Admitting: Oncology

## 2023-10-22 ENCOUNTER — Encounter: Payer: Self-pay | Admitting: Oncology

## 2023-10-22 ENCOUNTER — Inpatient Hospital Stay: Attending: Oncology

## 2023-10-22 ENCOUNTER — Ambulatory Visit: Payer: Self-pay | Admitting: Oncology

## 2023-10-22 VITALS — BP 163/75 | HR 63 | Temp 96.0°F | Resp 19 | Ht 69.0 in | Wt 187.2 lb

## 2023-10-22 DIAGNOSIS — D509 Iron deficiency anemia, unspecified: Secondary | ICD-10-CM | POA: Insufficient documentation

## 2023-10-22 DIAGNOSIS — L659 Nonscarring hair loss, unspecified: Secondary | ICD-10-CM

## 2023-10-22 DIAGNOSIS — R233 Spontaneous ecchymoses: Secondary | ICD-10-CM | POA: Insufficient documentation

## 2023-10-22 DIAGNOSIS — E039 Hypothyroidism, unspecified: Secondary | ICD-10-CM

## 2023-10-22 DIAGNOSIS — I10 Essential (primary) hypertension: Secondary | ICD-10-CM

## 2023-10-22 DIAGNOSIS — K5190 Ulcerative colitis, unspecified, without complications: Secondary | ICD-10-CM

## 2023-10-22 DIAGNOSIS — E7800 Pure hypercholesterolemia, unspecified: Secondary | ICD-10-CM

## 2023-10-22 LAB — CBC WITH DIFFERENTIAL (CANCER CENTER ONLY)
Abs Immature Granulocytes: 0.01 K/uL (ref 0.00–0.07)
Basophils Absolute: 0 K/uL (ref 0.0–0.1)
Basophils Relative: 1 %
Eosinophils Absolute: 0.1 K/uL (ref 0.0–0.5)
Eosinophils Relative: 3 %
HCT: 41 % (ref 36.0–46.0)
Hemoglobin: 14.1 g/dL (ref 12.0–15.0)
Immature Granulocytes: 0 %
Lymphocytes Relative: 30 %
Lymphs Abs: 1.2 K/uL (ref 0.7–4.0)
MCH: 32 pg (ref 26.0–34.0)
MCHC: 34.4 g/dL (ref 30.0–36.0)
MCV: 93 fL (ref 80.0–100.0)
Monocytes Absolute: 0.4 K/uL (ref 0.1–1.0)
Monocytes Relative: 10 %
Neutro Abs: 2.3 K/uL (ref 1.7–7.7)
Neutrophils Relative %: 56 %
Platelet Count: 170 K/uL (ref 150–400)
RBC: 4.41 MIL/uL (ref 3.87–5.11)
RDW: 11.2 % — ABNORMAL LOW (ref 11.5–15.5)
WBC Count: 4.1 K/uL (ref 4.0–10.5)
nRBC: 0 % (ref 0.0–0.2)

## 2023-10-22 LAB — IRON AND TIBC
Iron: 112 ug/dL (ref 28–170)
Saturation Ratios: 33 % — ABNORMAL HIGH (ref 10.4–31.8)
TIBC: 340 ug/dL (ref 250–450)
UIBC: 228 ug/dL

## 2023-10-22 LAB — APTT: aPTT: 28 s (ref 24–36)

## 2023-10-22 LAB — VITAMIN B12: Vitamin B-12: 1832 pg/mL — ABNORMAL HIGH (ref 180–914)

## 2023-10-22 LAB — PROTIME-INR
INR: 1 (ref 0.8–1.2)
Prothrombin Time: 13.5 s (ref 11.4–15.2)

## 2023-10-22 LAB — FERRITIN: Ferritin: 69 ng/mL (ref 11–307)

## 2023-10-22 NOTE — Progress Notes (Signed)
 Patient doing okay, she is 3 weeks post op from left knee surgery. She does have some new rashes that popped up overnight that she is questioning. She also states she still has some hair-loss as well.

## 2023-10-22 NOTE — Progress Notes (Signed)
 Hematology/Oncology Consult note [facility]  [phone] [phone]  Patient Care Team: Stephanie Charlene CROME, MD as PCP - General (Family Medicine) Melanee Annah BROCKS, MD as Consulting Physician (Oncology)   Name of the patient: Rebecca Strong  [id number]  Feb 19, 1956   Date of visit: 10/22/23  Diagnosis-iron  deficiency anemia  Chief complaint/ Reason for visit-routine follow-up of iron  deficiency anemia  Heme/Onc history:  Patient is a 68 year old female with a past medical history significant for hypothyroidism hypertension hyperlipidemia among other medical problems. She has been referred for iron  deficiency anemia. She has been a frequent blood donor about every 4 months. Most recent CBC from October 2023 showed an H&H of 12.4/40.8 with an MCV of 77. White count and platelets were normal. Ferritin level was low at 8 with an iron  saturation of 9%. TIBC was elevated at 469. CMP within normal limits.  She has had colonoscopy in April 2022 which was unremarkable.   Patient last received IV iron  in June 2024.  Interval history-patient states that her hair loss has stabilized but her hair quality remains quite thin.  She is concerned that her microcytic anemia may have been related to COVID-vaccine.  She also reports noticing spontaneous tiny blood blisters which subsequently resulted in small bruises over her bilateral forearms.  She has not had any trauma to these areas.  She is also interested in considering hormone replacement therapy.  ECOG PS- 0 Pain scale- 0   Review of systems- Review of Systems  Constitutional:  Negative for chills, fever, malaise/fatigue and weight loss.  HENT:  Negative for congestion, ear discharge and nosebleeds.   Eyes:  Negative for blurred vision.  Respiratory:  Negative for cough, hemoptysis, sputum production, shortness of breath and wheezing.   Cardiovascular:  Negative for chest pain, palpitations, orthopnea  and claudication.  Gastrointestinal:  Negative for abdominal pain, blood in stool, constipation, diarrhea, heartburn, melena, nausea and vomiting.  Genitourinary:  Negative for dysuria, flank pain, frequency, hematuria and urgency.  Musculoskeletal:  Negative for back pain, joint pain and myalgias.  Skin:  Negative for rash.  Neurological:  Negative for dizziness, tingling, focal weakness, seizures, weakness and headaches.  Endo/Heme/Allergies:  Does not bruise/bleed easily.  Psychiatric/Behavioral:  Negative for depression and suicidal ideas. The patient does not have insomnia.       No Known Allergies   Past Medical History:  Diagnosis Date   Anxiety    Essential hypertension 01/05/2022   FH: hemochromatosis    Insomnia    Iron  deficiency anemia    Paresthesia    Plantar fasciitis    Pure hypercholesterolemia 01/05/2022     Past Surgical History:  Procedure Laterality Date   KNEE ARTHROSCOPY Left    SHOULDER SURGERY      Social History   Socioeconomic History   Marital status: Married    Spouse name: Not on file   Number of children: Not on file   Years of education: Not on file   Highest education level: Not on file  Occupational History   Not on file  Tobacco Use   Smoking status: Never   Smokeless tobacco: Never  Vaping Use   Vaping status: Never Used  Substance and Sexual Activity   Alcohol use: Yes    Alcohol/week: 2.0 standard drinks of alcohol    Types: 1 Glasses of wine, 1 Cans of beer per week    Comment: occasional drinks.   Drug use: Never   Sexual activity:  Not on file  Other Topics Concern   Not on file  Social History Narrative   Not on file   Social Drivers of Health   Financial Resource Strain: Not on file  Food Insecurity: Not on file  Transportation Needs: Not on file  Physical Activity: Not on file  Stress: Not on file  Social Connections: Not on file  Intimate Partner Violence: Not on file    No family history on  file.   Current Outpatient Medications:    mupirocin ointment (BACTROBAN) 2 %, 3 (three) times daily., Disp: , Rfl:    oxyCODONE (OXY IR/ROXICODONE) 5 MG immediate release tablet, Take 1 tablet every 4 hours by oral route as needed, for pain., Disp: , Rfl:    aspirin EC 81 MG tablet, Take 81 mg by mouth daily. Swallow whole., Disp: , Rfl:    Cholecalciferol 50 MCG (2000 UT) CAPS, Take by mouth., Disp: , Rfl:    cyanocobalamin  (VITAMIN B12) 500 MCG tablet, Take by mouth., Disp: , Rfl:    Furosemide (LASIX PO), Take by mouth. Pt taking 20 mg daily, Disp: , Rfl:    gabapentin (NEURONTIN) 300 MG capsule, Take 300 mg by mouth 2 (two) times daily. Pt reports taking one daily, Disp: , Rfl:    hydrochlorothiazide (HYDRODIURIL) 12.5 MG tablet, Take 12.5 mg by mouth., Disp: , Rfl:    levothyroxine (SYNTHROID) 100 MCG tablet, Take 100 mcg by mouth daily., Disp: , Rfl:    meclizine  (ANTIVERT ) 25 MG tablet, Take 1 tablet (25 mg total) by mouth 3 (three) times daily as needed., Disp: 20 tablet, Rfl: 0   meloxicam  (MOBIC ) 15 MG tablet, Take 1 tablet (15 mg total) by mouth daily., Disp: 90 tablet, Rfl: 2   montelukast (SINGULAIR) 10 MG tablet, Take 10 mg by mouth daily., Disp: , Rfl:    ondansetron  (ZOFRAN -ODT) 4 MG disintegrating tablet, Take 1 tablet (4 mg total) by mouth every 8 (eight) hours as needed for nausea or vomiting., Disp: 20 tablet, Rfl: 0   potassium chloride (KCL) 2 mEq/mL SOLN oral liquid, Take by mouth., Disp: , Rfl:    rosuvastatin (CRESTOR) 10 MG tablet, Take 10 mg by mouth at bedtime., Disp: , Rfl:   Current Facility-Administered Medications:    triamcinolone  acetonide (KENALOG ) 10 MG/ML injection 10 mg, 10 mg, Other, Once, [place], [place] RAMAN, DPM  Physical exam: There were no vitals filed for this visit. Physical Exam Cardiovascular:     Rate and Rhythm: Normal rate and regular rhythm.     Heart sounds: Normal heart sounds.  Pulmonary:     Effort: Pulmonary effort is normal.      Breath sounds: Normal breath sounds.  Skin:    General: Skin is warm and dry.     Comments: Superficial areas of bruising noted over the distal aspect of bilateral forearms crossing over into the wrist  Neurological:     Mental Status: She is alert and oriented to person, place, and time.      I have personally reviewed labs listed below:    Latest Ref Rng & Units 10/12/2023    9:43 AM  CMP  Glucose 70 - 99 mg/dL 893   BUN 8 - 23 mg/dL 11   Creatinine 9.55 - 1.00 mg/dL 9.16   Sodium 864 - 854 mmol/L 141   Potassium 3.5 - 5.1 mmol/L 3.9   Chloride 98 - 111 mmol/L 104   CO2 22 - 32 mmol/L 25   Calcium 8.9 -  10.3 mg/dL 9.7   Total Protein 6.5 - 8.1 g/dL 6.7   Total Bilirubin 0.0 - 1.2 mg/dL 0.8   Alkaline Phos 38 - 126 U/L 82   AST 15 - 41 U/L 25   ALT 0 - 44 U/L 23       Latest Ref Rng & Units 10/22/2023   10:50 AM  CBC  WBC 4.0 - 10.5 K/uL 4.1   Hemoglobin 12.0 - 15.0 g/dL 85.8   Hematocrit 63.9 - 46.0 % 41.0   Platelets 150 - 400 K/uL 170    I have personally reviewed Radiology images listed below: No images are attached to the encounter.  MR BRAIN WO CONTRAST Result Date: 10/12/2023 EXAM: MRI BRAIN WITHOUT CONTRAST 10/12/2023 01:53:07 PM TECHNIQUE: Multiplanar multisequence MRI of the head/brain was performed without the administration of intravenous contrast. COMPARISON: MRI of the head dated 12/21/2019. CLINICAL HISTORY: Headache, neuro deficit. FINDINGS: BRAIN AND VENTRICLES: No acute infarct. No intracranial hemorrhage. No mass. No midline shift. No hydrocephalus. The sella is unremarkable. Normal flow voids. Mild periventricular and deep cerebral white matter disease present. There is blooming artifact present within the hypertension. There is no significant change from the previous study. ORBITS: No acute abnormality. SINUSES AND MASTOIDS: No acute abnormality. BONES AND SOFT TISSUES: Normal marrow signal. No acute soft tissue abnormality. IMPRESSION: 1. No acute  intracranial abnormality. 2. Mild periventricular and deep cerebral white matter disease, stable compared to the prior study dated 12/21/2019. Electronically signed by: evalene coho 10/12/2023 02:07 PM EDT RP Workstation: [id number]   CT HEAD WO CONTRAST Result Date: 10/12/2023 EXAM: CT HEAD WITHOUT 10/12/2023 09:49:58 AM TECHNIQUE: CT of the head was performed without the administration of intravenous contrast. Automated exposure control, iterative reconstruction, and/or weight based adjustment of the mA/kV was utilized to reduce the radiation dose to as low as reasonably achievable. COMPARISON: MRI head dated 12/21/2019. CLINICAL HISTORY: Headache, neuro deficit. Pt c/o dizziness and unsteady gait started w/ waking this morning around 0430 and intermittent R side headache. LKW 2300. Pt reports she is 2 week post op L knee arthroscopy. Pt reports she feels like she is leaning to the R. Facial symmetry and clear speech noted. Denies extremity weakness. FINDINGS: BRAIN AND VENTRICLES: No acute intracranial hemorrhage. Subtle hyperattenuating focus along the anterior falx which may reflect prominent mineralization versus a partially calcified meningioma. No associated mass effect. No midline shift. Gray-white differentiation is maintained. No hydrocephalus. ORBITS: No acute abnormality. SINUSES AND MASTOIDS: No acute abnormality. SOFT TISSUES AND SKULL: No acute skull fracture. No acute soft tissue abnormality. IMPRESSION: 1. No acute intracranial abnormality. 2. Subtle hyperattenuating focus along the anterior falx, possibly representing prominent mineralization or a partially calcified meningioma. No associated mass effect or midline shift. Electronically signed by: Donnice Mania MD 10/12/2023 10:55 AM EDT RP Workstation: [id number]     Assessment and plan- Patient is a 68 y.o. female here for routine follow-up of iron  deficiency anemia  Patient last received IV iron  in March 2025.  Even back then she was  not anemic but only iron  deficient.  Today her CBC shows H&H of 14.1/41.  Iron  studies are currently pending.  I will repeat CBC ferritin and iron  studies in 4 months in 8 months and I will see her back in 8 months.  I have again reassured the patient that microcytic anemia is not related to COVID-vaccine.  Also patient does not presently have any evidence of microcytosis.  Her iron  studies are presently normal with a ferritin  of greater than 50 and iron  saturation of more than 20%.  She does not require any IV iron  at this time.  Patient has small areas of bruising over bilateral forearms which are spontaneous and not related to trauma.  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  I am checking PT PTT INR and von Willebrand panel today and we will call her with the results of the blood work.  Patient is interested in considering hormone replacement therapy but I have discussed with the patient that this would be a discussion with her primary care doctor.  From a hematology standpoint I do not see a reason to check her estrogen levels today.  Patient noted to have systolic blood pressure of the 180s in our office today.  She needs to follow-up with her primary care doctor for the same as well    Visit Diagnosis 1. Iron  deficiency anemia, unspecified iron  deficiency anemia type   2. Hair loss   3. Essential hypertension   4. Easy bruising      Dr. Annah Skene, MD, MPH [facility] [phone] 10/22/2023 11:07 AM

## 2023-10-22 NOTE — Telephone Encounter (Signed)
-----   Message from Annah JAYSON Skene sent at 10/22/2023 12:14 PM EDT ----- Please let her know that her ferritin levels are more than 50 and iron  saturation more than 20%.  She is not iron  deficient at this time and does not require any IV iron  ----- Message ----- From: Interface, Lab In [place] Sent: 10/22/2023  11:05 AM EDT To: Annah JAYSON Skene, MD

## 2023-10-22 NOTE — Telephone Encounter (Signed)
 Per Dr. Melanee Please let her know that her ferritin levels are more than 50 and iron  saturation more than 20%. She is not iron  deficient at this time and does not require any IV iron .  Outbound call; detailed voice message left.

## 2023-10-23 LAB — VON WILLEBRAND PANEL
Coagulation Factor VIII: 155 % — ABNORMAL HIGH (ref 56–140)
Ristocetin Co-factor, Plasma: 238 % — ABNORMAL HIGH (ref 50–200)
Von Willebrand Antigen, Plasma: 241 % — ABNORMAL HIGH (ref 50–200)

## 2023-10-23 LAB — COAG STUDIES INTERP REPORT

## 2023-10-28 ENCOUNTER — Encounter: Payer: Self-pay | Admitting: Oncology

## 2023-10-28 ENCOUNTER — Ambulatory Visit: Payer: Self-pay | Admitting: Oncology

## 2023-11-02 ENCOUNTER — Encounter: Payer: Self-pay | Admitting: Oncology

## 2023-11-02 NOTE — Telephone Encounter (Signed)
 Outbound call reviewed labs as sent in the my chart message patient has not reviewed as of yet.  Informed patient if she has particular inquiries regarding specific labs she can go to [email or web address] and look up the name of the particular test for more information.  Assured her Dr. Melanee did not note any immediate concerns in her recent labs.  Per last OV wrap up Iron  studies are currently pending. I will repeat CBC ferritin and iron  studies in 4 months in 8 months and I will see her back in 8 months. Patient is not scheduled for f/u; will forward to scheduling to coordinate.

## 2023-11-02 NOTE — Telephone Encounter (Signed)
 Did you call her?

## 2023-11-02 NOTE — Telephone Encounter (Signed)
 My chart message sent yesterday 11/01/23 to follow up on inquiry.  No further follow up needed.

## 2023-11-03 ENCOUNTER — Encounter: Payer: Self-pay | Admitting: Oncology

## 2023-11-05 ENCOUNTER — Other Ambulatory Visit: Payer: Medicare Other

## 2023-11-05 ENCOUNTER — Ambulatory Visit: Payer: Medicare Other | Admitting: Oncology

## 2024-02-21 ENCOUNTER — Inpatient Hospital Stay: Attending: Oncology

## 2024-02-21 DIAGNOSIS — D509 Iron deficiency anemia, unspecified: Secondary | ICD-10-CM

## 2024-02-21 DIAGNOSIS — L659 Nonscarring hair loss, unspecified: Secondary | ICD-10-CM

## 2024-02-21 LAB — CBC (CANCER CENTER ONLY)
HCT: 43.1 % (ref 36.0–46.0)
Hemoglobin: 14.5 g/dL (ref 12.0–15.0)
MCH: 31.9 pg (ref 26.0–34.0)
MCHC: 33.6 g/dL (ref 30.0–36.0)
MCV: 94.7 fL (ref 80.0–100.0)
Platelet Count: 160 K/uL (ref 150–400)
RBC: 4.55 MIL/uL (ref 3.87–5.11)
RDW: 12.2 % (ref 11.5–15.5)
WBC Count: 4.2 K/uL (ref 4.0–10.5)
nRBC: 0 % (ref 0.0–0.2)

## 2024-02-21 LAB — IRON AND TIBC
Iron: 147 ug/dL (ref 28–170)
Saturation Ratios: 36 % — ABNORMAL HIGH (ref 10.4–31.8)
TIBC: 407 ug/dL (ref 250–450)
UIBC: 260 ug/dL

## 2024-02-21 LAB — FERRITIN: Ferritin: 32 ng/mL (ref 11–307)

## 2024-03-14 ENCOUNTER — Telehealth: Payer: Self-pay

## 2024-03-14 NOTE — Telephone Encounter (Signed)
 Spoke to patient again today, she would like to stop oral iron  & come in to receive an infusion to see if that will make a difference in her labs when she comes to get them checked on 06/21/24.

## 2024-03-14 NOTE — Progress Notes (Signed)
 If she is tolerating oral iron  well I am okay to wait on IV iron  infusions and see what her labs show at next visit

## 2024-03-23 ENCOUNTER — Inpatient Hospital Stay: Attending: Oncology

## 2024-03-23 ENCOUNTER — Other Ambulatory Visit: Payer: Self-pay | Admitting: Nurse Practitioner

## 2024-03-23 VITALS — BP 138/72 | HR 62 | Temp 98.2°F | Resp 14

## 2024-03-23 DIAGNOSIS — D509 Iron deficiency anemia, unspecified: Secondary | ICD-10-CM | POA: Insufficient documentation

## 2024-03-23 MED ORDER — SODIUM CHLORIDE 0.9 % IV SOLN
INTRAVENOUS | Status: DC
  Filled 2024-03-23: qty 250

## 2024-03-23 MED ORDER — SODIUM CHLORIDE 0.9 % IV SOLN
1000.0000 mg | Freq: Once | INTRAVENOUS | Status: AC
  Administered 2024-03-23: 1000 mg via INTRAVENOUS
  Filled 2024-03-23: qty 1000

## 2024-03-23 NOTE — Patient Instructions (Signed)

## 2024-06-21 ENCOUNTER — Other Ambulatory Visit

## 2024-06-21 ENCOUNTER — Ambulatory Visit: Admitting: Oncology
